# Patient Record
Sex: Male | Born: 1960 | Race: White | Hispanic: No | Smoking: Never smoker
Health system: Southern US, Community
[De-identification: ages and names within clinical notes are randomized; demographics above are authoritative.]

## PROBLEM LIST (undated history)

## (undated) DIAGNOSIS — K219 Gastro-esophageal reflux disease without esophagitis: Secondary | ICD-10-CM

## (undated) DIAGNOSIS — K859 Acute pancreatitis without necrosis or infection, unspecified: Secondary | ICD-10-CM

## (undated) HISTORY — PX: ABDOMINAL SURGERY: SHX537

## (undated) HISTORY — PX: PR VASECTOMY UNI/BI SPX W/POSTOP SEMEN EXAMS: 55250

## (undated) HISTORY — PX: PR TONSILLECTOMY PRIMARY/SECONDARY <AGE 12: 42825

## (undated) HISTORY — PX: PR LAPS ABD PRTM&OMENTUM DX W/WO SPEC BR/WA SPX: 49320

---

## 2001-06-20 ENCOUNTER — Ambulatory Visit: Payer: Self-pay

## 2001-12-12 ENCOUNTER — Ambulatory Visit: Payer: Self-pay

## 2002-01-26 ENCOUNTER — Ambulatory Visit

## 2002-02-21 ENCOUNTER — Ambulatory Visit

## 2009-08-14 ENCOUNTER — Ambulatory Visit

## 2009-09-10 ENCOUNTER — Encounter: Payer: Self-pay | Admitting: Family Medicine

## 2009-09-10 ENCOUNTER — Ambulatory Visit

## 2009-09-10 ENCOUNTER — Ambulatory Visit: Admitting: Family Medicine

## 2009-09-10 DIAGNOSIS — M7540 Impingement syndrome of unspecified shoulder: Secondary | ICD-10-CM | POA: Insufficient documentation

## 2009-09-10 DIAGNOSIS — M940 Chondrocostal junction syndrome [Tietze]: Secondary | ICD-10-CM | POA: Insufficient documentation

## 2009-09-10 DIAGNOSIS — F439 Reaction to severe stress, unspecified: Secondary | ICD-10-CM | POA: Insufficient documentation

## 2009-09-10 NOTE — Patient Instructions (Addendum)
For counseling, see Darrell Whitaker [phone] or Darrell Rogers, MD [phone] or Darrell Layman, MD, PhD [phone] (practice may be full) or Darrell Whitaker [phone] in [place] or Darrell Russel, PhD at [phone] in [place]. Also Darrell Whitaker in [place] at [phone] and Darrell Whitaker at [phone].    EXERCISE: Get at least 30 minutes of uninterrupted aerobic exercise at least every other day. This includes bicycling, jogging, swimming, an aerobics class, or dancing. If instead you choose hiking or walking, this should be done without stopping for at least 45 minutes. All exercise should be done to break a mild sweat and increase our breathing and heart rate, but only as long as we can still carry on a conversation while exercising.     I recommend  avoidance of the sun, especially from 10am to 4pm when the sun is strongest. Wear protective clothing (especially a broad-rimmed hat to cover ears and nose) and sunglasses  Adequate sunscreening for every day use may be different than needed for outdoor activities. For limited everyday exposure, a light, SPF 15 or greater lotion may be applied in the morning. Many make up foundations contain sunscreen that may perform this function. I particularly like Olay Complete SPF 15 (contains Zinc).     For any significant outdoor activity, please use a more water-resistant, more protective sunscreen. There are many excellent types available. I recommend SPF 30 or greater sunscreen with both UVA and UVB protection when outdoors. Reapply sunscreen if ever in the water, even if the sunscreen claims that it is waterproof. The best sunscreens contain Zinc oxide. Zinc makes the sunscreen more effective in blocking the longer wavelength UV rays. Titanium dioxide and Parsol also block longer wavelength rays, but not  quite as effectively in most preparations. Some products that I recommend include:  1. Blue Lizard  2. Total Block  3. Vanicream  4. Neutrogena dry touch 55, 70,  and 85  5. Spectra 3  6. Trader Joe's 30+  A newly approved long wavelength blocking agent, Mexoryl, has recently become available in a few combination preparations. Two types that are available  on the internet are: Lyda Jester and Anthelios  Some of the others listed are also found more easily on the internet or can be ordered by your pharmacist.    [facility] SHOULD I GO TO?  Should you require hospital admission, we are pleased to offer the services  of [facility], where [facility] hospital-based physicians provide in-patient care. The [facility] Emergency Department is open 24 hours per day, 7 days per week. [facility] is located at [facility] in [place]. The telephone number is [phone].    HOW DO I E-MAIL MY DOCTOR?  To contact your physician by e-mail, ask my medical assistant for an identifier number and instructions for "My Chart"    HOW DO I TAKE CARE OF PRESCRIPTION REFILLS?  To obtain prescription refills, ask your primary care physician during your office appointment. If you need refills and don't have an office appointment, contact your pharmacy. Your pharmacy will then contact your physician for refill authorization. We will refill the prescription within 48 hours. If a holiday or weekend is coming please allow extra time.     To refill a triplicate prescription needed for some narcotics, contact our office. The medical assistant will contact you when the triplicate is ready for pick up or if you need a doctors appointment first.     If you use a mail  order pharmacy and need a 90 day prescription, contact our office. The medical assistant will contact you when the 90 day prescription is ready for pick up.    The 'on call' physician will not authorize prescription refills.  Please remember, Darrell Whitaker and good medical practice require your doctor to perform a 'good faith medical examination' prior to prescribing any medication.     HOW DO I  GET COPIES OF MY RECORDS?  To request copies of your medical records, you will need to complete and sign a medical release form. You may complete a release form at the front desk or you may mail one in. Adults must sign their own release form. A parent or guardian must sign for anyone under 38 years of age. State law requires that records be released within 15 days of signing a medical release form. A fee may apply for copies of your records.       RELIABLE WEBSITES FOR HEALTH INFORMATION:   OSTEOPEROSIS INFORMATION: National Osteoperosis Foundation = RecruitSuit.[place]  Also U.S. Dept. of Health Services = www.https://www.Waterman-walter.com/   MANAGING CHRONIC DISEASE BeverageRep.es. You can also call [phone] for information for management of chronic disease, including  diabetes, asthma, heart failure, weight management, and smoking cessation.   EXERCISE RELATED: bodyforlife.com   OTHER GENERAL WEBSITES:  AwakeningMoments.co.nz   www.cancer.org   www.diabetes.org   www.americanheart.org   FootballExhibition.com.br   www.clinicaltrials.gov   www.familydoctor.org   .....Marland KitchenMarland KitchenOne of my favorites  www.healthfinder.gov   www.kidshealth.org   TanExchange.nl   ......Marland KitchenMarland KitchenAnother of my favorites  http://medlineplus.gov   www.cancer.gov   https://www.west.net/   www.tobaccofreeca.com   nihseniorhealth.com     MEDICAL ALERT CARDS * www.medicalert.org or [phone] - to put on a person for emergency use.   ADVANCED MEDICAL DIRECTIVE FORMS:  RecruitSuit.co.za and other publications.   TRAVEL: For health information on international travel contact [facility]. Phone: [phone]     Please bring your MEDICATION LIST ( include name of medication, dose and frequency of use. Include all over-the-counter medications and drug allergies) to all office visits and any emergency room visits.     Questions? Call 1-800-2-[place] or email at ForwardButton.com.br    The [facility] :[address] , [place], [place] [address] , Phone: [phone] ; LABORATORY HOURS: 7:00 AM - 5:00 PM MONDAY-FRIDAY No appointment needed: [phone] ; driving directions: [address] at [address] Turn right at [address] and right at [employer].     FREE CONFIDENTIAL RAPID HIV testing for the virus that causes AIDS. Results in 20 minutes. Contact the [facility] in [place]: [phone]     The following is a list of HEALTH SCREENING GUIDELINES. If you are not current you are advised to schedule a COMPLETE PHYSICAL EXAM to review your general health and bring up to date your screening tests.   WELL WOMEN EXAM: Begin screen within 1 year of onset of sexual activity or age 57 which ever comes first. It is recommended that you have a pelvic and pap smear examination yearly until you have two consecutively normal exams. After that, examination ever 3 years is acceptable for screening assuming all past pap smears have been normal. This examination also includes a breast check. Remember, any time you have pelvic or breast symptoms you should be reexamined.   MAMMOGRAM SCREENING: This is important for breast cancer screening. It is recommended yearly starting at age 56.   BREAST  EXAMINATION: Recommended to be performed by a physician every year after age 65. This is a cancer screening examination in addition to the annual mammogram. Both exams complement each other.   COLONOSCOPY SCREENING: It is recommended that you have a colonoscopy every 10 years, for colon cancer screening, starting at age 78 years old. If you have primary family members (father, mother, brother, sister) with colon cancer you should start screening at least 10 years before the age that they were diagnosed with colon cancer. This is a special instrument that is threaded through the colon, after colon preparation, to visualize the inside surface of the colon looking for polyps and precancerous  lesions.   CHOLESTEROL BLOOD SCREENING: This should be done routinely every 3-5 years. If your values are elevated above normal, testing should be done more frequently per your doctor's advice. Cholesterol along with other risk factors can help predict your future risk of cardiovascular events. Other risk factors include; a positive family history (father, mother , brother, sister) for premature heart attack, ( men less than 10 years old and women less than 59 years old), current or recent tobacco use, diabetes, a waist measurement for women (at the largest diameter near the belly button) of 35 inches or greater and for men 40 inches or greater, fasting blood sugar of 100 or greater, high blood pressure above 120/80. Cholesterol guidelines for lowering cardiovascular risk include a total cholesterol of less than 200, HDL(good) of at least 40 or above in men and 50 or above in women (higher is always better), LDL (bad) best if less than 130 and, if you are at high risk because of multiple risk factors, i.e. diabetes, previous heart attack, stroke, peripheral vascular disease, then the LDL goal is less than 70.  BLOOD PRESSURE: Should be monitored periodically to assure that your values are in the normal range most of the time. Blood pressure should usually be monitored after 10 minutes of quiet rest, sitting in a chair with feet on the floor. Studies show that <120/80 has the lowest risk. If your average blood pressure goes above 130/85 you should consult your physician regarding management. Most pharmacy locations have complementary blood pressure measuring machines, but your own machine at home is more accurate. Salt restriction, less than 2300 mg daily, can be very helpful. Nutritional labels will list sodium content of foods.   IMMUNIZATIONS: CHILDHOOD IMMUNIZATIONS are advised through age 39. Ask doctor for details. TETANUS/DIPTHERIA advised for adults every ten years life long, at least every 5 years for  significant skin breaking injuries in high risk jobs.  FLU VACCINATION annually > age 39 and in high risk persons and in kids age 57 months to 5 years of age. PNEUMONIA VACCINATION at least one time after 65 and every 5 years for those who have had their spleens removed or high risk individuals with diabetes, significant lung disease or other debilitating diseases. ZOSTER VACCINATION for all persons > 60. This immunization is to help reduce the risk of Herpes Zoster/Shingles and the severe chronic pain that could linger after shingles. VARICELLA VACCINATION for all adults without evidence of varicella (chickenpox) immunity. HEPATITIS A VACCINATION advised for all high risk patients including, chronic liver disease, clotting factor recipients, men who have sex with men, illicit drug users, and travelers to endemic areas. HEPATITIS B VACCINATION advised for all high risk patients including, HIV, chronic liver disease, end stage renal disease, for those who are single and are sexually active , men who have sex  with men, IV drug users, HBV patient household/sexual contacts, inmates, healthcare workers, clients/staff working with developmentally disabled or high risk patients and travel to endemic areas. HUMAN PAPILLOMAVIRUS VACCINATION (HPV) (Gardasil) advised for women age 44-26, administration preferred prior to becoming sexually active. MENINGOCOCCAL VACCINATION advised 11-12 year olds or before entering high school; for high risk persons age 24-55 including college freshman in dormitories, lab workers exposed to [employer]. meningitidis, [employer] recruits, those who have their spleens removed, and travelers to endemic areas.   VISION SCREENING: This is recommended every 2 years after 40 to screen for glaucoma, cataracts, macular degeneration and other eye diseases. If you have diabetes or severe high blood pressure it is recommended that you be examined annually by an ophthalmologist or optometrist.   TOBACCO: Tobacco in any  form is a hazard to good health. It can cut your life short and create needless loss and suffering. No amount of tobacco is considered safe. This includes the use of marijuana.   For further information consult: LearningDermatology.pl.   ALCOHOL: There are health and social risks of excess alcohol intake. Most health professionals would advise no more than two drinks daily for men and one drink daily for women. The following would be equal to one drink: Wine = 5 ounces, Beer = 12 ounces, Hard liquor = 1 ounce.   EXERCISE: Get at least 30 minutes of aerobic physical activity on at least 4 days a week. This exercise can be walking, hiking, cycling, swimming, dancing, or an aerobics class.   WEIGHT: Should be monitored periodically to maintain your weight in the normal range for optimal health. Body Mass Index (BMI) is used to help determine your ideal body weight. This is a calculation using your weight and height measurement. Ideal range is 18-25. Below 25 is in the healthy range. Below 18 also carries health risks equal to being overweight. By definition if you are in the 25-30 range you are considered overweight. If you are in the 30-40 range you are considered obese. If you are above 40 you are considered morbidly obese. Being overweight carries significant risk to your health such as cancer, arthritis, diabetes, hypertension, stroke, heart attacks and other cardiovascular diseases.   HIGH FIBER, LOW FAT DIET. High fiber comes from vegetables, fruits, products made with 100% whole grains, legumes, and raw nuts (not fried in oil). Fats should be limited to no more than 10% of your total calories if you are trying to loose weight and no more than 30% of your total calories for maintenance. The best fats are vegetable fats which are monounsaturated. This would be olive oil and canola oil. Other oils (corn & soy) should be avoided because they cause inflammation in the body which increases health risk. The best fats  are cold pressed but are more unstable; some may need to be refrigerated. Animal fats should be at the lowest consumption level. Trans fats or hydrogenated fats should be avoided. Reading nutrition labels on the package can be very helpful. High fructose corn syrup is a fat building sweetener and should be avoided. Other sugars should be limited to no more than 75 gm daily.    Use rubberband to do daily shoulder exercises. Do 30 inward rotations and 30 outward rotations with forearm on your lap once or twice daily. Once improving, you can add 30 arm lifts in a standing position. Do all of these exercises for 2 months , then as needed if symptoms return.

## 2009-09-10 NOTE — Nursing Note (Signed)
>>   Darrell Whitaker     Tue Sep 10, 2009  4:30 PM  Immunization VIS documentation(s) were given to pt to review. All questions were answered and the patient consented to the Immunization(s) being given. Patient allergies were reviewed and no contraindications were found. The immunization(s) were given as ordered. The patient was observed for any immediate reactions to the vaccine. None were observed.Medication verified with Doctor Ruben Gottron     >> Darrell Whitaker     Tue Sep 10, 2009  4:13 PM  Vital signs taken, allergies verified, screened for pain,   Darrell Malling [place], MA

## 2009-09-10 NOTE — Progress Notes (Signed)
SUBJECTIVE:  Darrell Whitaker is a 49yr old male who comes in for a get acquainted visit. I have reviewed the patient's medical history in detail and updated the computerized patient record.   The patient requests a physical exam / complains of months of left posterior shoulder pain especially with use of arm to his side; no known injury or neck / arm symptoms.  Also complains of occasional soreness left edge of sternum for few months especially after long work day. Also stressed by work Engineer, manufacturing systems; 7 years at [employer]  Significant past medical problems include pancreatitis surgery age 32; scar tissue abdomen removed age 23s; ok since.  Is due for a physical exam.  Is in need of adacel vaccine(s).    No current outpatient prescriptions on file.    Review of Systems -   Constitutional: negative.  Ears, Nose, Mouth, Throat: negative.  CV: negative.  Resp: negative.  GI: negative.  Musculoskeletal: negative.  Psych: Mood pt's report, euthymic; feels fine when not at work.    No past medical history on file.  Past Surgical History   Procedure Date    Vasectomy, unilat/bilat, w/postop semen exam     Lap,diagnostic abdomen age 31, 30s     pancreatitis, and remove scar tissue    Removal of tonsils,<12 y/o        History   Social History    Marital Status: MARRIED     Spouse Name: Scientist, research (medical)     Number of Children: 2    Years of Education: N/A   Occupational History    [employer]    Social History Main Topics    Smoking status: Never Smoker     Smokeless tobacco: Never Used    Alcohol Use: 1.0 oz/week     2 drink(s) per week    Drug Use: No    Sexually Active: Yes -- Male partner(s)     Birth Control/ Protection: Surgical   Other Topics Concern    Not on file   Social History Narrative    No narrative on file       OBJECTIVE:  Vital signs noted  Mental status exam;  is alert, oriented  to time, person and place. Normal thought content, speech, affect, mood and dress are noted.    S1 and S2 normal, no  murmurs, clicks, gallops or rubs. Regular rate and rhythm. Chest is clear; no wheezes or rales. No edema or JVD.   Shoulder exam left shows positive impingement signs are present with pain at high arc of abduction and forward flexion. There is no tenderness or shoulder laxity. Left mid costal-chondral joint slight tenderness.      ASSESSMENT:  786.52Q Chest wall pain  V05.8 Vaccin for disease NEC  726.2AR Shoulder impingement Left  733.6K Costochondritis  V62.89E Situational stress    Educated regarding adapting to work stress; to increase aerobic exercise , do counseling, set expectations  Follow up and advice per Epicare orders and patient instructions.  Call if any problems worsen or if develops any new signs or symptoms of illness.  More than 50% of 45 minute visit was spent counseling the patient on their 5 medical diagnosis as above.  Barriers to Learning assessed: none. Patient verbalizes understanding of teaching and instructions.  Electronically signed by:  Conception Oms, MD    Associate Physician  [facility] [place] [facility]   Phone [phone]

## 2009-10-10 NOTE — Progress Notes (Signed)
SUBJECTIVE:  Darrell Whitaker is a 49yr old male presenting for physical exam .  Patient Active Problem List   Diagnoses Code    Shoulder impingement Left 726.2AR    Costochondritis 733.6K    Situational stress V62.89E    Last PE 5/11 V70.0     No current outpatient prescriptions on file.      ALLERGIES:  Not on File    New concerns: none; labs ordered since none here previously    ROS:  Constitutional: negative for fatigue, unintentional weight loss, fever.  Eyes: negative for blurry vision, red eyes, eye pain.  Ears, Nose, Mouth, Throat: no difficulty swallowing, hoarseness, post nasal drip, nosebleeds, rhinorrhea, loss of sense of smell, hearing loss, ear pain, discharge from ears.  CV: no palpitations, syncope, chest pain, paroxysmal nocturnal dyspnea, orthopnea, lower extremity edema, claudication.  Resp: no cough, hemoptysis, shortness of breath; gets some snoring but rare am fatigue; no history of  apnea spells while sleeping, pleuritic pain, wheezing, dyspnea on exertion.  GI: no vomiting, dysphagia, nausea; does get heartburn twice a week for last 18 months ; denies early satiety, hematemesis, abdominal pain, bleeding from rectum, melena, hematochezia, constipation, diarrhea.  GU: no nocturia, dysuria, frequency, hesitancy, hematuria, incontinence, lumps in testicles, sexual difficulties.  Musculoskeletal: no AM joint stiffness, joint swelling, new joint pain, back pain, muscle weakness, muscle pain.  Integumentary: no moles that have changed, rash, bruising, lumps or bumps, hair loss.  Neuro: no confusion, headaches, feel faint, seizures, vertigo, memory loss, paralysis/weakness, numbness or tingling, clumsiness, tremor.  Psych: no ahedonia, depressed mood, insomnia, anxiety, suicidal ideation.  Endo: no cold intolerance, heat intolerance, polyphagia, polydipsia, polyuria, excessive hair growth.  Heme/Lymphatic: no anemia, bleeding disorder, abnormal bleeding, abnormal bruising, swollen  nodes.  Allergy/Immun: no itchy eyes, itchy nose, clear nasal secretions.  Breast: no lump in breast, nipple bleeding or discharge.    OBJECTIVE: The patient appears well, in no apparent distress.  Vital signs noted; repeat blood pressure 122/78  Scalp / head grossly normal.  Oral mucosa: normal  Eyes: Conjunctivae and corneas clear. PERRL, EOM's intact.   Ears: Normal TMs and canal.  Nose: nasal turbinates with pink color and no edema  Neck: Supple. No adenopathy, or thyromegaly. If age >37: normal carotid pulses without bruits  Heart: S1 & S2 normal, normal rate and regular rhythm, no murmurs, clicks, or gallops.  Lungs: Clear, good air entry, no wheezes, rhonchi or rales.  Chest: No chest deformities or masses noted. No axillary adenopathy.  Abdomen: Abdomen slightly obese; soft without tenderness, guarding, masses or organomegaly. No bruit.  Genital/Rectal Exam: genitals normal without hernia, rectal normal, no masses, guiac negative stool, prostate normal in size without masses or nodules  Extremities: No cyanosis, clubbing, or edema.  Skin: No suspicious lesions; no rash except patches of excoriated eczematous skin on fingers, elbows . Neuro: Alert and oriented. CN 2-12 grossly normal. Strength symmetrical. Gait normal. Negative Romberg. No tremor or coordination deficit.    EKG: normal    ASSESSMENT/PLAN:  V70.0 Routine general medical examination at a health care facility  (primary encounter diagnosis)  726.2AR Shoulder impingement Left  V81.0A Screening, heart disease, ischemic  V75.9B Screening examination for infectious disease  V76.44D Prostate cancer screening  V78.0A Screening, anemia, deficiency, iron    Health Care Maintenance: c-scope due in 2 years   PLAN: per Epicare orders and patient instructions.  Next physical / comp eval in 1-2 year(s).   I did review patient's past medical and  family/social history, no changes noted.  Barriers to Learning assessed: none. Patient verbalizes understanding of  teaching and instructions.    Electronically signed by:  Conception Oms, MD    Associate Physician  [facility] [place] [facility]   Phone [phone]

## 2009-10-11 ENCOUNTER — Ambulatory Visit: Admitting: Family Medicine

## 2009-10-11 ENCOUNTER — Ambulatory Visit

## 2009-10-11 VITALS — BP 146/87 | HR 85 | Temp 98.0°F | Resp 16 | Ht 72.0 in | Wt 184.0 lb

## 2009-10-11 MED ORDER — TRIAMCINOLONE ACETONIDE 0.1 % TOPICAL CREAM
TOPICAL_CREAM | Freq: Two times a day (BID) | TOPICAL | Status: AC
Start: 2009-10-11 — End: 2010-10-11

## 2009-10-11 NOTE — Patient Instructions (Signed)
Call if not better regarding rashes within 2 weeks     To help your dry skin, use skin moisturizers such as  Eucerin and Lubriderm or Keri lotion twice a day, including just after bathing (apply to skin while it is still wet). Avoid excessive soap and bathing which may dry the skin. Use only unscented Dove or Neutrogena soap when washing hands or showering. Do not use liquid soaps. Call if your skin is not much improved after a month of these measures.  Can also apply vaseline to hands (under white cotton gloves) at bedtime and remove the next morning if having severe dry skin of fingers or hands.    EXERCISE: Get at least 30 minutes of uninterrupted aerobic exercise at least every other day. This includes bicycling, jogging, swimming, an aerobics class, or dancing. If instead you choose hiking or walking, this should be done without stopping for at least 45 minutes. All exercise should be done to break a mild sweat and increase our breathing and heart rate, but only as long as we can still carry on a conversation while exercising.

## 2009-10-11 NOTE — Nursing Note (Signed)
>>   Darrell Whitaker     Fri Oct 11, 2009  4:33 PM  EKG performed Vital signs taken, allergies verified, screened for pain,   Jeanelle Malling [place], MA

## 2009-12-02 ENCOUNTER — Encounter

## 2011-03-04 ENCOUNTER — Ambulatory Visit

## 2011-03-04 NOTE — Nursing Note (Signed)
>>   Darrell Whitaker     Wed Mar 04, 2011  5:56 PM  The Inactivated Influenza Vaccine VIS document was given to patient to review and any questions were referred to the physician.  The patient was asked the following questions to determine if there is any reason we should not give the Influenza Vaccine;  Is the person to be vaccinated sick today?no  Does the person to be vaccinated have an allergy to eggs, latex or to a component of the vaccine?no  Has the person to be vaccinated ever had a serious reaction to influenza vaccine in the past?no  Has the person to be vaccinated ever had Guillain-Barre syndrome?no   The Influenza Vaccine was then administered per protocol. The patient was observed for immediate reactions to the vaccine per protocol. None were observed.

## 2012-08-24 ENCOUNTER — Telehealth: Payer: Self-pay | Admitting: FAMILY PRACTICE

## 2012-08-24 ENCOUNTER — Encounter: Payer: Self-pay | Admitting: FAMILY PRACTICE

## 2012-08-24 ENCOUNTER — Ambulatory Visit: Admitting: FAMILY PRACTICE

## 2012-08-24 ENCOUNTER — Ambulatory Visit: Payer: Commercial Managed Care - PPO

## 2012-08-24 DIAGNOSIS — Z8719 Personal history of other diseases of the digestive system: Secondary | ICD-10-CM | POA: Insufficient documentation

## 2012-08-24 MED ORDER — CYCLOBENZAPRINE 10 MG TABLET
5.0000 mg | ORAL_TABLET | Freq: Every day | ORAL | Status: AC | PRN
Start: 2012-08-24 — End: 2012-09-23

## 2012-08-24 MED ORDER — HYDROCODONE 5 MG-ACETAMINOPHEN 325 MG TABLET
1.0000 | ORAL_TABLET | Freq: Four times a day (QID) | ORAL | Status: AC | PRN
Start: 2012-08-24 — End: 2012-09-23
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
## 2012-08-24 NOTE — Patient Instructions (Addendum)
For your back - I would recommending icing your back tonight, then start using warm compresses tomorrow.  Be sure to keep your back limber and stretch it out regularly.    The key to prompt recovery is get your back moving ASAP with easy, gentle exercises.        For the pain, you should start with either ibuprofen or naproxen.  If you are still having pain, you may use hydrocodone on top of the ibuprofen or naproxen. I have also given you a rx for cyclobenzeprine, which is a muscle relaxant, but is very sedating.  It will help you sleep.           Low Back Pain: Exercises  Your Care Instructions  Here are some examples of typical rehabilitation exercises for your condition. Start each exercise slowly. Ease off the exercise if you start to have pain.  Your doctor or physical therapist will tell you when you can start these exercises and which ones will work best for you.  How to do the exercises  Press-up    1. Lie on your stomach, supporting your body with your forearms.  2. Press your elbows down into the floor to raise your upper back. As you do this, relax your stomach muscles and allow your back to arch without using your back muscles. As your press up, do not let your hips or pelvis come off the floor.  3. Hold for 15 to 30 seconds, then relax.  4. Repeat 2 to 4 times.  Alternate arm and leg (bird dog) exercise    Note: Do this exercise slowly. Try to keep your body straight at all times, and do not let one hip drop lower than the other.  1. Start on the floor, on your hands and knees.  2. Tighten your belly muscles.  3. Raise one leg off the floor, and hold it straight out behind you. Be careful not to let your hip drop down, because that will twist your trunk.  4. Hold for about 6 seconds, then lower your leg and switch to the other leg.  5. Repeat 8 to 12 times on each leg.  6. Over time, work up to holding for 10 to 30 seconds each time.  7. If you feel stable and secure with your leg raised, try raising  the opposite arm straight out in front of you at the same time.  Knee-to-chest exercise    1. Lie on your back with your knees bent and your feet flat on the floor.  2. Bring one knee to your chest, keeping the other foot flat on the floor (or keeping the other leg straight, whichever feels better on your lower back).  3. Keep your lower back pressed to the floor. Hold for at least 15 to 30 seconds.  4. Relax, and lower the knee to the starting position.  5. Repeat with the other leg. Repeat 2 to 4 times with each leg.  6. To get more stretch, put your other leg flat on the floor while pulling your knee to your chest.  Curl-ups    1. Lie on the floor on your back with your knees bent at a 90-degree angle. Your feet should be flat on the floor, about 12 inches from your buttocks.  2. Cross your arms over your chest.  3. Slowly tighten your belly muscles and raise your shoulder blades off the floor.  4. Keep your head in line with your body, and  do not press your chin to your chest.  5. Hold this position for 1 or 2 seconds, then slowly lower yourself back down to the floor.  6. Repeat 8 to 12 times.  Pelvic tilt exercise    1. Lie on your back with your knees bent.  2. "Brace" your stomach. This means to tighten your muscles by pulling in and imagining your belly button moving toward your spine. You should feel like your back is pressing to the floor and your hips and pelvis are rocking back.  3. Hold for about 6 seconds while you breathe smoothly.  4. Repeat 8 to 12 times.  Heel dig bridging    1. Lie on your back with both knees bent and your ankles bent so that only your heels are digging into the floor. Your knees should be bent about 90 degrees.  2. Then push your heels into the floor, squeeze your buttocks, and lift your hips off the floor until your shoulders, hips, and knees are all in a straight line.  3. Hold for about 6 seconds as you continue to breathe normally, and then slowly lower your hips back down  to the floor and rest for up to 10 seconds.  4. Do 8 to 12 repetitions.  Hamstring stretch in doorway    1. Lie on your back in a doorway, with one leg through the open door.  2. Slide your leg up the wall to straighten your knee. You should feel a gentle stretch down the back of your leg.  3. Hold the stretch for at least 15 to 30 seconds. Do not arch your back, point your toes, or bend either knee. Keep one heel touching the floor and the other heel touching the wall.  4. Repeat with your other leg.  5. Do 2 to 4 times for each leg.  Hip flexor stretch    1. Kneel on the floor with one knee bent and one leg behind you. Place your forward knee over your foot. Keep your other knee touching the floor.  2. Slowly push your hips forward until you feel a stretch in the upper thigh of your rear leg.  3. Hold the stretch for at least 15 to 30 seconds. Repeat with your other leg.  4. Do 2 to 4 times on each side.  Wall sit    1. Stand with your back 10 to 12 inches away from a wall.  2. Lean into the wall until your back is flat against it.  3. Slowly slide down until your knees are slightly bent, pressing your lower back into the wall.  4. Hold for about 6 seconds, then slide back up the wall.  5. Repeat 8 to 12 times.  Follow-up care is a key part of your treatment and safety. Be sure to make and go to all appointments, and call your doctor if you are having problems. It's also a good idea to know your test results and keep a list of the medicines you take.    Where can you learn more?    Go to [email or web address]   Enter [phone] in the search box to learn more about "Low Back Pain: Exercises."     2006-2013 [employer]. Care instructions adapted under license by [facility]. This care instruction is for use with your licensed healthcare professional. If you have questions about a medical condition or this instruction, always ask your healthcare professional. [employer] disclaims any warranty  or liability for your use of this information.  Content Version: 9.7.145117; Last Revised: April 01, 2010
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
## 2012-09-15 ENCOUNTER — Ambulatory Visit: Payer: Commercial Managed Care - PPO

## 2012-09-20 ENCOUNTER — Ambulatory Visit: Payer: Commercial Managed Care - PPO

## 2012-09-21 ENCOUNTER — Ambulatory Visit: Payer: Commercial Managed Care - PPO | Admitting: FAMILY PRACTICE

## 2013-01-23 ENCOUNTER — Other Ambulatory Visit: Payer: Self-pay

## 2013-01-28 ENCOUNTER — Other Ambulatory Visit: Payer: Self-pay

## 2013-02-04 ENCOUNTER — Other Ambulatory Visit: Payer: Self-pay

## 2013-02-18 ENCOUNTER — Other Ambulatory Visit: Payer: Self-pay

## 2013-04-01 ENCOUNTER — Other Ambulatory Visit: Payer: Self-pay

## 2014-02-10 ENCOUNTER — Other Ambulatory Visit: Payer: Self-pay

## 2014-12-06 ENCOUNTER — Ambulatory Visit: Payer: Commercial Managed Care - PPO | Admitting: Legal Medicine

## 2014-12-06 ENCOUNTER — Encounter: Payer: Self-pay | Admitting: Legal Medicine

## 2014-12-06 VITALS — BP 116/74 | HR 68 | Temp 97.3°F | Resp 20 | Ht 72.0 in | Wt 189.1 lb

## 2014-12-06 DIAGNOSIS — R21 Rash and other nonspecific skin eruption: Secondary | ICD-10-CM

## 2014-12-06 DIAGNOSIS — F4321 Adjustment disorder with depressed mood: Secondary | ICD-10-CM

## 2014-12-06 DIAGNOSIS — F4329 Adjustment disorder with other symptoms: Principal | ICD-10-CM

## 2014-12-06 MED ORDER — TRIAMCINOLONE ACETONIDE 0.1 % TOPICAL CREAM
TOPICAL_CREAM | Freq: Two times a day (BID) | TOPICAL | Status: DC
Start: 2014-12-06 — End: 2015-10-22

## 2014-12-06 NOTE — Nursing Note (Signed)
Patient identification verified by last name and date of birth.  Darrell Whitaker is here to establish care with Dr. Jeronimo Greaves.  Patient is here to discuss mood problems following the passing of his father.  Jefm Petty, MA I

## 2014-12-06 NOTE — Progress Notes (Signed)
Chief Complaint   Patient presents with    Establish Care    Depression     Depression/Anxiety; Grief Response     Rash     torso and back of thighs       Darrell Whitaker is a 54yr old male who presents as a new patient to this clinic with need to establish with a new PCP.    Has concerns about depression and anxiety.  Recent death of father.  Died in 08/12/22 this year, 4 months ago.  Troubled with all the bad things that happened, his father was a "terrible" father.  He feels this is not effecting his relationships or work.  But does get very emotional at times when he thinks about how he can not even grieve normally.    Has new rash. Torso for last week.  Itchy. No knew exposures.      Body temperatures: no fever apparent.    REVIEW OF SYSTEMS:  Constitutional: negative.  Eyes: negative.  Ears, Nose, Mouth, Throat: negative.  CV: negative.  Resp: negative.  GI: heartburn or reflux.  GU: negative.  Musculoskeletal: muscle pain.  Integumentary: rash.  Neuro: negative.  Psych: crying spells.      Patient Active Problem List   Diagnosis    Shoulder impingement Left    Costochondritis    Situational stress    History of pancreatitis     No past medical history on file.  Past Surgical History   Procedure Laterality Date    Vasectomy, unilat/bilat, w/postop semen exam      Pr lap,diagnostic abdomen  age 51, 30s     pancreatitis, and remove scar tissue    Pr removal of tonsils,<12 y/o       History   Substance Use Topics    Smoking status: Never Smoker     Smokeless tobacco: Never Used    Alcohol Use: 1.2 oz/week     2 Standard drinks or equivalent per week     Family History   Problem Relation Age of Onset    Allergies Mother     Allergies Sister     Allergies Brother     Cancer Paternal Grandfather      liver?    Diabetes Maternal Grandfather     Cancer Paternal Uncle      liver cancer    Heart Father     Asthma Mother     Asthma Brother     Thyroid Sister        ACTIVE MEDICATIONS (patient  reports taking):  No outpatient prescriptions have been marked as taking for the 12/06/14 encounter (Office Visit) with Lorenda Hatchet, MD.       ALLERGIES:  No Known Allergies    PHYSICAL EXAM:   BP 116/74 mmHg  Pulse 68  Temp(Src) 36.3 C (97.3 F) (Tympanic)  Resp 20  Ht 1.829 m (6')  Wt 85.775 kg (189 lb 1.6 oz)  BMI 25.64 kg/m2  SpO2 99%  GEN:   The patient appears in no apparent distress and is well developed and well nourished  SKIN:  Maculopapular rash and torso, raised areas of lower flanks, urticaria like.   PSYCHOLOGICAL:  Normal thought content, speech, affect, mood, and dress are noted.  PHQ9: 6.  Major criteria; 0,2.  Suicidal Ideation; 0.  GAD7: 3.  Major criteria; 1,0.    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  Pharmacological treatment not indicated at this time.  Plan: he agrees to telepsychiatry.      (R21) Rash due to allergy  Comment: Possible Grover's.     Plan: topical steroid.    I spent more than 25 min with the patient, more than half the time in case management and counseling.      Return if symptoms worsen or fail to improve.     Patient instructed to seek medical reevaluation if treatment is not as effective as anticipated, condition worsens, or has side effects from medication.  Barriers to Learning assessed: none. Patient verbalizes understanding of teaching and instructions.    Electronically signed by:  Elaina Hoops, MD  Diplomate of American Board of Family Practice  [facility]  Phone: [phone]

## 2015-01-09 ENCOUNTER — Ambulatory Visit: Payer: Self-pay

## 2015-01-15 ENCOUNTER — Telehealth: Payer: Self-pay | Admitting: Legal Medicine

## 2015-01-15 NOTE — Telephone Encounter (Signed)
Patient was seen last week in the telepsych program and  wants to know when he should schedule/d a follow up appointment - he wants to schedule/d an appointment this Friday but is not sure if that is to soon  Please review chart notes  Please advise  Please call

## 2015-01-15 NOTE — Telephone Encounter (Signed)
Please notify patient, too soon for any recommendations from telepsychiatry.  Can take a week or two.  I well contact him when the recommendations come in.

## 2015-01-15 NOTE — Telephone Encounter (Signed)
Patient identified x3, patient informed

## 2015-01-25 ENCOUNTER — Other Ambulatory Visit: Payer: Self-pay | Admitting: Legal Medicine

## 2015-01-25 ENCOUNTER — Encounter: Payer: Self-pay | Admitting: Psychiatry

## 2015-01-25 DIAGNOSIS — F321 Major depressive disorder, single episode, moderate: Secondary | ICD-10-CM | POA: Insufficient documentation

## 2015-01-25 MED ORDER — SERTRALINE 50 MG TABLET
50.0000 mg | ORAL_TABLET | Freq: Every day | ORAL | 5 refills | Status: AC
Start: 2015-01-25 — End: 2016-01-20
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
## 2015-01-28 NOTE — Progress Notes (Signed)
Darrell Hatchet, MD  Darrell Whitaker Fp 3 days ago              Please notify patient, psychiatry has advised he start sertraline, has been ordered.,   Should be seen in 4 weeks by me for follow up.  (Routing comment)

## 2015-01-28 NOTE — Progress Notes (Signed)
Patient identification verified x 3.  Advised patient of prescription sent and advice to follow up in 4 weeks. Patient states he might need sooner follow up to discuss disability and he is going through a divorce. He was unable to schedule while I am on the phone because he is very upset and stressed about setting appointments with his attorney for the divorce. He will call back in a couple days to make an appointment. He states it will probably be more like 2 weeks vs the suggested 4 weeks since he is very stressed and upset.  Jefm Petty, MA I

## 2015-02-22 ENCOUNTER — Telehealth: Payer: Self-pay

## 2015-02-26 NOTE — Telephone Encounter (Signed)
Patient called again for number to HIM to get records regarding psychiatric visit   No further follow up needed for this message.

## 2015-06-25 ENCOUNTER — Ambulatory Visit: Payer: Self-pay

## 2015-07-04 ENCOUNTER — Ambulatory Visit: Payer: Self-pay

## 2015-07-16 ENCOUNTER — Encounter: Payer: Self-pay | Admitting: Psychiatry

## 2015-07-16 NOTE — Progress Notes (Signed)
Medical Record Number:   [id number]  Patient:   Darrell Whitaker  Date of Birth:   Mar 31, 1961  Date of Visit:   07/16/2015  Referring Physician/Site: PCN    Telepsychiatry Note - Research Patient  Synchronous/Asynchronous  Follow up - 6 Mo/12 Mo/18 Mo/24 Mo    GAF: 63    CGI: 3    Length of assessment and documentation: 40 minutes    Presenting Complaint: Depression    History of Present Illness:  Darrell Whitaker is a 55yr old male who has recently experienced the death of his father and his wife being unfaithful and decided to leave him.  Both of these events have resulted in profound depression and anxiety, with no active SI, plan or intent.  No manic or psychotic symptoms and no recent use of ETOH / illicit drugs.  Darrell Whitaker is quite lonely and is crying more than ever.  He has seen a therapist 4-5 times and is reading a therapeutic book, which he finds helpful.  He is also more interested in geetting involved with religion.  He has not started the recommended and prescribed Zoloft, and seems he might not be interested in doing this.  He had a major "set back" yesterday when thinking about his wife.    Current medications:  Current Outpatient Prescriptions on File Prior to Visit   Medication Sig Dispense Refill    Sertraline (ZOLOFT) 50 mg Tablet Take 1 tablet by mouth every morning. 30 tablet 5    Triamcinolone (KENALOG) 0.1 % Cream Apply  to the affected area 2 times daily. (rash) Until clear. 30 g 0     No current facility-administered medications on file prior to visit.             Labs:   Chemistry Profile: No results found for this or any previous visit.  CBC with Differential: No results found for this or any previous visit.  Cholesterol/Lipids: No results found for this or any previous visit.  Liver Enzyme: No results found for this or any previous visit.  Fasting Glucose: No results found for this or any previous visit.  Lithium: No results found for this or any previous visit.  Valproate: No  results found for this or any previous visit.  Carbamazepine: No results found for this or any previous visit.  Prolactin: No results found for this or any previous visit.  Thyroid-stimulating Hormone: No results found for this or any previous visit.     MSE: Attitude: pleasant   Speech: normal volume, rate, and pitch  Mood (pt's report) :Mood pt's report, depressed mood  Affect: blunted  Suicidal Ideation: no  Thought Process: logical and goal directed  Thought Content: no thought disorder or psychotic symptoms  Judgment: good  Psychosis: no  Cognition is intact.   Insight: good.    Impression and Codes:  1. GAD   2. MDD - recurrent - moderate  Self-harm / violence potential: behavior = low risk for suicide    Treatment Recommendations:  1. Suggest reconsideration of Zoloft 50 mg, as this may help with his depression  2. Recommend more frequent supportive psychotherapy  Follow-up:  1. Primary care: make changes and see patient in 6 wks to assess.  2. e-MH:Research telepsychiatry in 6 mos prn or doc-to-doc consultation by EMR messaging  3. [facility]/Emergency Department    Suggestions for further information to be collected, or for further assessment at future telepsych consults:       Electronically Signed By:  Selinda Flavin, DO  [phone]

## 2015-07-17 NOTE — Progress Notes (Signed)
Please notify patient, he should make appointment to discuss treatment recommendations by psychiatry.

## 2015-07-19 NOTE — Progress Notes (Signed)
Called and gave patient Dr San JettyJames Nichol's request - patient is more then willing to schedule/d the appointment but wants to know if the appointment will be covered with research monies , at this time money is very tight due to personal issues and he can not afford to be seen   Please advise    Patient knows that Dr Elaina HoopsJames Nichol is out of office until Monday

## 2015-07-21 NOTE — Progress Notes (Signed)
Please notify patient, visit is not covered by research money.

## 2015-07-23 NOTE — Progress Notes (Signed)
Spoke to patient and he states he has a high deductible and really can't afford the cost of a office visit especially without knowing the cost in order to try and budget for it.  He said he'll have to call back for scheduling.    Darrell Whitaker  [facility]  [facility]  [address]

## 2015-09-12 ENCOUNTER — Encounter: Payer: Self-pay | Admitting: Legal Medicine

## 2015-09-12 ENCOUNTER — Ambulatory Visit: Payer: Commercial Managed Care - PPO | Admitting: Legal Medicine

## 2015-09-12 VITALS — BP 132/82 | HR 60 | Temp 97.0°F | Resp 16 | Wt 185.2 lb

## 2015-09-12 VITALS — BP 132/82 | HR 60 | Temp 97.0°F | Resp 16 | Ht 72.0 in | Wt 185.2 lb

## 2015-09-12 DIAGNOSIS — F324 Major depressive disorder, single episode, in partial remission: Principal | ICD-10-CM

## 2015-09-12 NOTE — Nursing Note (Signed)
Patient identification verified by last name and date of birth.  Since you last saw a [facility] provider, have you seen by any other physicians or surgeons outside [facility] , been to emergency room/urgent care or been hospitalized? no  Have you started any over the counter medications, herbal supplements, vitamins or home remedies? No  Patient here today for Telepsych discussion.  Darrell Whitaker Katrinka [facility]

## 2015-09-12 NOTE — Progress Notes (Signed)
Chief Complaint   Patient presents with    Other     Telepsych discussion       Darrell Whitaker is a [age]yr old male who presents for depression follow up.     He has been seen by Telepsychiatry; this is follow up for that visit.    He had not started the prescribed sertraline.     He is feeling much better, much less depressed.    Still angry at his wife. He has been reading a book that has been very helpful.  Anger is under better control.  He read "Real Love" by Clemencia CourseGreg Bear.   She is still causing problems; now financial.     REVIEW OF SYSTEMS:  Manic symptoms; none.  Body temperatures: no fever apparent.  Constitutional: negative.  GI: negative.  Neuro: negative.    Patient Active Problem List   Diagnosis    Shoulder impingement Left    Costochondritis    Situational stress    History of pancreatitis    Major depressive disorder, single episode, moderate     No past medical history on file.  Social History   Substance Use Topics    Smoking status: Never Smoker    Smokeless tobacco: Never Used    Alcohol use 1.2 oz/week     2 Standard drinks or equivalent per week         ACTIVE MEDICATIONS (patient reports taking):  No outpatient prescriptions have been marked as taking for the 09/12/15 encounter (Office Visit) with Lorenda HatchetJames William Halley Kincer, MD.       ALLERGIES:  No Known Allergies    PHYSICAL EXAM:   BP 132/82  Pulse 60  Temp 36.1 C (97 F) (Tympanic)  Resp 16  Ht 1.829 m (6')  Wt 84 kg (185 lb 3.2 oz)  BMI 25.12 kg/m2  GEN:   The patient appears in no apparent distress and is well developed and well nourished  PSYCHOLOGICAL:  PHQ9: 2.  Major criteria; 0,1.  Suicidal Ideation; 0.    Mental Status Examination:    Appearance: Well related, cooperative, good eye contact, fully oriented    Mood: "Good"    Affect: Full and appropriate, with normal range    Speech: Normal - regular rate, rhythm and volume    Motor: Normal - no tics, tremors or extrapyramidal effects (EPS) noted    Thought Process: No formal  thought disorders appreciated    Thought Content: No abnormalities of thought content are appreciated, including no suicidal ideation or intent    Insight: Good    Judgment: Good    Reliability: Good      ASSESSMENT & PLAN:  (F32.4) Major depressive disorder with single episode, in partial remission  (primary encounter diagnosis)  Comment: established problem improving.  No pharmacological treatment indicated at this time.  Plan: expectant management.       Discussed option of psychologic counseling; patient has been in counseling.    Return if symptoms worsen or fail to improve.     Patient instructed to seek medical reevaluation if treatment is not as effective as anticipated, condition worsens, or has side effects from medication.  Barriers to Learning assessed: none. Patient verbalizes understanding of teaching and instructions.    Electronically signed by:  Elaina HoopsJames Christy Ehrsam, MD  Diplomate of American Board of Family Practice  [facility] [facility]  Phone: [phone]

## 2015-10-22 ENCOUNTER — Other Ambulatory Visit: Payer: Self-pay | Admitting: Legal Medicine

## 2016-01-08 ENCOUNTER — Ambulatory Visit: Payer: Self-pay

## 2016-01-22 ENCOUNTER — Ambulatory Visit: Payer: Self-pay

## 2016-01-23 ENCOUNTER — Ambulatory Visit: Payer: Self-pay

## 2018-03-30 DIAGNOSIS — F4323 Adjustment disorder with mixed anxiety and depressed mood: Secondary | ICD-10-CM | POA: Diagnosis not present

## 2018-04-20 DIAGNOSIS — F4323 Adjustment disorder with mixed anxiety and depressed mood: Secondary | ICD-10-CM | POA: Diagnosis not present

## 2018-04-29 DIAGNOSIS — F4323 Adjustment disorder with mixed anxiety and depressed mood: Secondary | ICD-10-CM | POA: Diagnosis not present

## 2018-05-05 DIAGNOSIS — F4323 Adjustment disorder with mixed anxiety and depressed mood: Secondary | ICD-10-CM | POA: Diagnosis not present

## 2018-05-19 DIAGNOSIS — F4323 Adjustment disorder with mixed anxiety and depressed mood: Secondary | ICD-10-CM | POA: Diagnosis not present

## 2019-04-17 DIAGNOSIS — M9903 Segmental and somatic dysfunction of lumbar region: Secondary | ICD-10-CM | POA: Diagnosis not present

## 2019-04-17 DIAGNOSIS — M9904 Segmental and somatic dysfunction of sacral region: Secondary | ICD-10-CM | POA: Diagnosis not present

## 2019-04-17 DIAGNOSIS — M545 Low back pain: Secondary | ICD-10-CM | POA: Diagnosis not present

## 2019-04-17 DIAGNOSIS — M461 Sacroiliitis, not elsewhere classified: Secondary | ICD-10-CM | POA: Diagnosis not present

## 2019-04-18 DIAGNOSIS — M9903 Segmental and somatic dysfunction of lumbar region: Secondary | ICD-10-CM | POA: Diagnosis not present

## 2019-04-18 DIAGNOSIS — M461 Sacroiliitis, not elsewhere classified: Secondary | ICD-10-CM | POA: Diagnosis not present

## 2019-04-18 DIAGNOSIS — M545 Low back pain: Secondary | ICD-10-CM | POA: Diagnosis not present

## 2019-04-18 DIAGNOSIS — M9904 Segmental and somatic dysfunction of sacral region: Secondary | ICD-10-CM | POA: Diagnosis not present

## 2019-04-26 DIAGNOSIS — M9903 Segmental and somatic dysfunction of lumbar region: Secondary | ICD-10-CM | POA: Diagnosis not present

## 2019-04-26 DIAGNOSIS — M461 Sacroiliitis, not elsewhere classified: Secondary | ICD-10-CM | POA: Diagnosis not present

## 2019-04-26 DIAGNOSIS — M545 Low back pain: Secondary | ICD-10-CM | POA: Diagnosis not present

## 2019-04-26 DIAGNOSIS — M9904 Segmental and somatic dysfunction of sacral region: Secondary | ICD-10-CM | POA: Diagnosis not present

## 2019-04-28 DIAGNOSIS — M9904 Segmental and somatic dysfunction of sacral region: Secondary | ICD-10-CM | POA: Diagnosis not present

## 2019-04-28 DIAGNOSIS — M461 Sacroiliitis, not elsewhere classified: Secondary | ICD-10-CM | POA: Diagnosis not present

## 2019-04-28 DIAGNOSIS — M545 Low back pain: Secondary | ICD-10-CM | POA: Diagnosis not present

## 2019-04-28 DIAGNOSIS — M9903 Segmental and somatic dysfunction of lumbar region: Secondary | ICD-10-CM | POA: Diagnosis not present

## 2019-05-08 DIAGNOSIS — M9903 Segmental and somatic dysfunction of lumbar region: Secondary | ICD-10-CM | POA: Diagnosis not present

## 2019-05-08 DIAGNOSIS — M545 Low back pain: Secondary | ICD-10-CM | POA: Diagnosis not present

## 2019-05-08 DIAGNOSIS — M461 Sacroiliitis, not elsewhere classified: Secondary | ICD-10-CM | POA: Diagnosis not present

## 2019-05-08 DIAGNOSIS — M9904 Segmental and somatic dysfunction of sacral region: Secondary | ICD-10-CM | POA: Diagnosis not present

## 2019-07-05 DIAGNOSIS — R0789 Other chest pain: Secondary | ICD-10-CM | POA: Diagnosis not present

## 2019-07-05 DIAGNOSIS — S29011A Strain of muscle and tendon of front wall of thorax, initial encounter: Secondary | ICD-10-CM | POA: Diagnosis not present

## 2019-07-05 DIAGNOSIS — S39011A Strain of muscle, fascia and tendon of abdomen, initial encounter: Secondary | ICD-10-CM | POA: Diagnosis not present

## 2019-08-17 DIAGNOSIS — K529 Noninfective gastroenteritis and colitis, unspecified: Secondary | ICD-10-CM | POA: Diagnosis not present

## 2019-08-17 DIAGNOSIS — Z20822 Contact with and (suspected) exposure to covid-19: Secondary | ICD-10-CM | POA: Diagnosis not present

## 2019-10-11 DIAGNOSIS — M9903 Segmental and somatic dysfunction of lumbar region: Secondary | ICD-10-CM | POA: Diagnosis not present

## 2019-10-11 DIAGNOSIS — M5136 Other intervertebral disc degeneration, lumbar region: Secondary | ICD-10-CM | POA: Diagnosis not present

## 2019-10-11 DIAGNOSIS — M5416 Radiculopathy, lumbar region: Secondary | ICD-10-CM | POA: Diagnosis not present

## 2019-10-11 DIAGNOSIS — M62830 Muscle spasm of back: Secondary | ICD-10-CM | POA: Diagnosis not present

## 2019-10-12 DIAGNOSIS — M5416 Radiculopathy, lumbar region: Secondary | ICD-10-CM | POA: Diagnosis not present

## 2019-10-12 DIAGNOSIS — M9903 Segmental and somatic dysfunction of lumbar region: Secondary | ICD-10-CM | POA: Diagnosis not present

## 2019-10-12 DIAGNOSIS — M62830 Muscle spasm of back: Secondary | ICD-10-CM | POA: Diagnosis not present

## 2019-10-12 DIAGNOSIS — M5136 Other intervertebral disc degeneration, lumbar region: Secondary | ICD-10-CM | POA: Diagnosis not present

## 2019-10-18 DIAGNOSIS — M5416 Radiculopathy, lumbar region: Secondary | ICD-10-CM | POA: Diagnosis not present

## 2019-10-18 DIAGNOSIS — M9903 Segmental and somatic dysfunction of lumbar region: Secondary | ICD-10-CM | POA: Diagnosis not present

## 2019-10-18 DIAGNOSIS — M62830 Muscle spasm of back: Secondary | ICD-10-CM | POA: Diagnosis not present

## 2019-10-18 DIAGNOSIS — M5136 Other intervertebral disc degeneration, lumbar region: Secondary | ICD-10-CM | POA: Diagnosis not present

## 2019-10-19 DIAGNOSIS — M9903 Segmental and somatic dysfunction of lumbar region: Secondary | ICD-10-CM | POA: Diagnosis not present

## 2019-10-19 DIAGNOSIS — M5136 Other intervertebral disc degeneration, lumbar region: Secondary | ICD-10-CM | POA: Diagnosis not present

## 2019-10-19 DIAGNOSIS — M62830 Muscle spasm of back: Secondary | ICD-10-CM | POA: Diagnosis not present

## 2019-10-19 DIAGNOSIS — M5416 Radiculopathy, lumbar region: Secondary | ICD-10-CM | POA: Diagnosis not present

## 2019-10-21 DIAGNOSIS — M62830 Muscle spasm of back: Secondary | ICD-10-CM | POA: Diagnosis not present

## 2019-10-21 DIAGNOSIS — M5416 Radiculopathy, lumbar region: Secondary | ICD-10-CM | POA: Diagnosis not present

## 2019-10-21 DIAGNOSIS — M5136 Other intervertebral disc degeneration, lumbar region: Secondary | ICD-10-CM | POA: Diagnosis not present

## 2019-10-21 DIAGNOSIS — M9903 Segmental and somatic dysfunction of lumbar region: Secondary | ICD-10-CM | POA: Diagnosis not present

## 2019-10-23 DIAGNOSIS — M5136 Other intervertebral disc degeneration, lumbar region: Secondary | ICD-10-CM | POA: Diagnosis not present

## 2019-10-23 DIAGNOSIS — M5416 Radiculopathy, lumbar region: Secondary | ICD-10-CM | POA: Diagnosis not present

## 2019-10-23 DIAGNOSIS — M9903 Segmental and somatic dysfunction of lumbar region: Secondary | ICD-10-CM | POA: Diagnosis not present

## 2019-10-23 DIAGNOSIS — M62830 Muscle spasm of back: Secondary | ICD-10-CM | POA: Diagnosis not present

## 2019-10-25 DIAGNOSIS — M5136 Other intervertebral disc degeneration, lumbar region: Secondary | ICD-10-CM | POA: Diagnosis not present

## 2019-10-25 DIAGNOSIS — M62830 Muscle spasm of back: Secondary | ICD-10-CM | POA: Diagnosis not present

## 2019-10-25 DIAGNOSIS — M9903 Segmental and somatic dysfunction of lumbar region: Secondary | ICD-10-CM | POA: Diagnosis not present

## 2019-10-25 DIAGNOSIS — M5416 Radiculopathy, lumbar region: Secondary | ICD-10-CM | POA: Diagnosis not present

## 2019-10-26 DIAGNOSIS — M62830 Muscle spasm of back: Secondary | ICD-10-CM | POA: Diagnosis not present

## 2019-10-26 DIAGNOSIS — M9903 Segmental and somatic dysfunction of lumbar region: Secondary | ICD-10-CM | POA: Diagnosis not present

## 2019-10-26 DIAGNOSIS — M5416 Radiculopathy, lumbar region: Secondary | ICD-10-CM | POA: Diagnosis not present

## 2019-10-26 DIAGNOSIS — M5136 Other intervertebral disc degeneration, lumbar region: Secondary | ICD-10-CM | POA: Diagnosis not present

## 2019-10-30 DIAGNOSIS — M9903 Segmental and somatic dysfunction of lumbar region: Secondary | ICD-10-CM | POA: Diagnosis not present

## 2019-10-30 DIAGNOSIS — M5136 Other intervertebral disc degeneration, lumbar region: Secondary | ICD-10-CM | POA: Diagnosis not present

## 2019-10-30 DIAGNOSIS — M62830 Muscle spasm of back: Secondary | ICD-10-CM | POA: Diagnosis not present

## 2019-10-30 DIAGNOSIS — M5416 Radiculopathy, lumbar region: Secondary | ICD-10-CM | POA: Diagnosis not present

## 2019-11-01 DIAGNOSIS — M62830 Muscle spasm of back: Secondary | ICD-10-CM | POA: Diagnosis not present

## 2019-11-01 DIAGNOSIS — M9903 Segmental and somatic dysfunction of lumbar region: Secondary | ICD-10-CM | POA: Diagnosis not present

## 2019-11-01 DIAGNOSIS — M5136 Other intervertebral disc degeneration, lumbar region: Secondary | ICD-10-CM | POA: Diagnosis not present

## 2019-11-01 DIAGNOSIS — M5416 Radiculopathy, lumbar region: Secondary | ICD-10-CM | POA: Diagnosis not present

## 2019-11-04 DIAGNOSIS — M5136 Other intervertebral disc degeneration, lumbar region: Secondary | ICD-10-CM | POA: Diagnosis not present

## 2019-11-04 DIAGNOSIS — M5416 Radiculopathy, lumbar region: Secondary | ICD-10-CM | POA: Diagnosis not present

## 2019-11-04 DIAGNOSIS — M62830 Muscle spasm of back: Secondary | ICD-10-CM | POA: Diagnosis not present

## 2019-11-04 DIAGNOSIS — M9903 Segmental and somatic dysfunction of lumbar region: Secondary | ICD-10-CM | POA: Diagnosis not present

## 2019-11-06 DIAGNOSIS — M62830 Muscle spasm of back: Secondary | ICD-10-CM | POA: Diagnosis not present

## 2019-11-06 DIAGNOSIS — M5136 Other intervertebral disc degeneration, lumbar region: Secondary | ICD-10-CM | POA: Diagnosis not present

## 2019-11-06 DIAGNOSIS — M5416 Radiculopathy, lumbar region: Secondary | ICD-10-CM | POA: Diagnosis not present

## 2019-11-06 DIAGNOSIS — M9903 Segmental and somatic dysfunction of lumbar region: Secondary | ICD-10-CM | POA: Diagnosis not present

## 2019-11-13 DIAGNOSIS — M9903 Segmental and somatic dysfunction of lumbar region: Secondary | ICD-10-CM | POA: Diagnosis not present

## 2019-11-13 DIAGNOSIS — M5416 Radiculopathy, lumbar region: Secondary | ICD-10-CM | POA: Diagnosis not present

## 2019-11-13 DIAGNOSIS — M62830 Muscle spasm of back: Secondary | ICD-10-CM | POA: Diagnosis not present

## 2019-11-13 DIAGNOSIS — M5136 Other intervertebral disc degeneration, lumbar region: Secondary | ICD-10-CM | POA: Diagnosis not present

## 2019-11-25 DIAGNOSIS — B354 Tinea corporis: Secondary | ICD-10-CM | POA: Diagnosis not present

## 2019-12-19 DIAGNOSIS — Z20822 Contact with and (suspected) exposure to covid-19: Secondary | ICD-10-CM | POA: Diagnosis not present

## 2019-12-19 DIAGNOSIS — U071 COVID-19: Secondary | ICD-10-CM | POA: Diagnosis not present

## 2019-12-27 ENCOUNTER — Emergency Department
Admission: EM | Admit: 2019-12-27 | Discharge: 2019-12-27 | Disposition: A | Discharge status: Home / Self Care | Payer: BC Managed Care – PPO | Attending: Emergency Medicine | Admitting: Emergency Medicine

## 2019-12-27 ENCOUNTER — Emergency Department: Payer: BC Managed Care – PPO

## 2019-12-27 ENCOUNTER — Other Ambulatory Visit: Payer: Self-pay

## 2019-12-27 DIAGNOSIS — R5383 Other fatigue: Secondary | ICD-10-CM | POA: Insufficient documentation

## 2019-12-27 DIAGNOSIS — R21 Rash and other nonspecific skin eruption: Secondary | ICD-10-CM | POA: Diagnosis not present

## 2019-12-27 DIAGNOSIS — U071 COVID-19: Secondary | ICD-10-CM | POA: Insufficient documentation

## 2019-12-27 DIAGNOSIS — R05 Cough: Secondary | ICD-10-CM | POA: Diagnosis not present

## 2019-12-27 MED ORDER — IVERMECTIN 3 MG PO TABS: 150.0000 ug/kg | ORAL_TABLET | Freq: Every day | ORAL | 0 refills | Status: AC

## 2019-12-27 MED ORDER — PREDNISONE 20 MG PO TABS
40.0000 mg | ORAL_TABLET | Freq: Every day | ORAL | 0 refills | Status: AC
Start: 2019-12-27 — End: 2020-01-01

## 2019-12-27 NOTE — ED Triage Notes (Signed)
Pt dx with COVID 8 days ago and states each day he was feeling better and better but today felt worse. C/o congestion and fatigue and cough. Is vaccinated. A&O, ambulatory.

## 2019-12-27 NOTE — Discharge Instructions (Signed)
Your chest XR is negative for pneumonia. Take the prescription meds as directed.

## 2019-12-27 NOTE — ED Provider Notes (Signed)
[facility] Emergency Department Provider Note ____________________________________________  Time seen: 1605  I have reviewed the triage vital signs and the nursing notes.  HISTORY  Chief Complaint  Fatigue and Cough  HPI Zachary Wilkins is a 59 y.o. male presents himself to the ED for evaluation of slightly worsening symptoms related to his Covid diagnosis.  Patient, who is fully vaccinated, describes being diagnosed almost 10 days prior.  He is nearing the end of his quarantine.  He became concerned because he began to experience increasing fatigue, and cough, and shortness of breath.   He denies any interim fever, nausea, vomiting, diarrhea.  He also denies any significant shortness of breath.  He has been taking over-the-counter medications for his symptoms intermittently, but was never prescribed any steroids, inhalers, cough medicine, or antibiotics.  History reviewed. No pertinent past medical history.  There are no problems to display for this patient.   Past Surgical History:  Procedure Laterality Date  . ABDOMINAL SURGERY      Prior to Admission medications   Medication Sig Start Date End Date Taking? Authorizing Provider  ivermectin (STROMECTOL) 3 MG TABS tablet Take 4 tablets (12,000 mcg total) by mouth daily for 5 doses. 12/27/19 01/01/20  Logon Uttech, Charlesetta Ivory, PA-C  predniSONE (DELTASONE) 20 MG tablet Take 2 tablets (40 mg total) by mouth daily with breakfast for 5 days. 12/27/19 01/01/20  Shalev Helminiak, Charlesetta Ivory, PA-C    Allergies Patient has no known allergies.  History reviewed. No pertinent family history.  Social History Social History   Tobacco Use  . Smoking status: Never Smoker  Substance Use Topics  . Alcohol use: Yes    Comment: "very light"  . Drug use: Not on file    Review of Systems  Constitutional: Negative for fever. Eyes: Negative for visual changes. ENT: Negative for sore throat. Cardiovascular: Negative for  chest pain. Respiratory: Positive for shortness of breath and cough. Gastrointestinal: Negative for abdominal pain, vomiting and diarrhea. Genitourinary: Negative for dysuria. Musculoskeletal: Negative for back pain. Skin: Negative for rash. Neurological: Negative for headaches, focal weakness or numbness. ____________________________________________  PHYSICAL EXAM:  VITAL SIGNS: ED Triage Vitals  Enc Vitals Group     BP 12/27/19 1355 (!) 136/99     Pulse Rate 12/27/19 1355 99     Resp 12/27/19 1355 18     Temp 12/27/19 1355 98.4 F (36.9 C)     Temp Source 12/27/19 1355 Oral     SpO2 12/27/19 1355 99 %     Weight 12/27/19 1356 182 lb (82.6 kg)     Height 12/27/19 1356 6' (1.829 m)     Head Circumference --      Peak Flow --      Pain Score 12/27/19 1356 0     Pain Loc --      Pain Edu? --      Excl. in GC? --     Constitutional: Alert and oriented. Well appearing and in no distress. Head: Normocephalic and atraumatic. Eyes: Conjunctivae are normal. PERRL. Normal extraocular movements Ears: Canals clear. TMs intact bilaterally. Nose: No congestion/rhinorrhea/epistaxis. Mouth/Throat: Mucous membranes are moist. Neck: Supple. No thyromegaly. Hematological/Lymphatic/Immunological: No cervical lymphadenopathy. Cardiovascular: Normal rate, regular rhythm. Normal distal pulses. Respiratory: Normal respiratory effort. No wheezes/rales/rhonchi. Gastrointestinal: Soft and nontender. No distention. Musculoskeletal: Nontender with normal range of motion in all extremities.  Neurologic:  Normal gait without ataxia. Normal speech and language. No gross focal neurologic deficits are appreciated. Skin:  Skin  is warm, dry and intact. No rash noted. ____________________________________________   RADIOLOGY  CXR  IMPRESSION: No active disease ____________________________________________  PROCEDURES  Procedures ____________________________________________  INITIAL IMPRESSION /  ASSESSMENT AND PLAN / ED COURSE  Patient with ED evaluation of continued symptoms related to recent Covid diagnosis.  Patient presented due to concern over increased cough and fatigue in the last few days.  Clinically patient is stable without signs of acute respiratory distress, dehydration, or septic appearance.  Chest x-ray does not reveal any acute intrathoracic process.  Patient is reassured overall by his exam findings.  He will be treated at this time with prescriptions with steroids and ivermectin, as he personally requested.  He will follow-up with his primary provider return to the ED as needed.  Hughes Wyndham was evaluated in Emergency Department on 12/28/2019 for the symptoms described in the history of present illness. He was evaluated in the context of the global COVID-19 pandemic, which necessitated consideration that the patient might be at risk for infection with the SARS-CoV-2 virus that causes COVID-19. Institutional protocols and algorithms that pertain to the evaluation of patients at risk for COVID-19 are in a state of rapid change based on information released by regulatory bodies including the CDC and federal and state organizations. These policies and algorithms were followed during the patient's care in the ED. ____________________________________________  FINAL CLINICAL IMPRESSION(S) / ED DIAGNOSES  Final diagnoses:  COVID-19      Lissa Hoard, PA-C 12/28/19 1537    Phineas Semen, MD 12/28/19 1725

## 2019-12-27 NOTE — ED Notes (Addendum)
Pt tested positive for covid x 1 week ago pt state his symptoms have improved until today, and pt is worried about his cough and fatigue r/t his 10 day quarantine coming up.

## 2020-01-05 ENCOUNTER — Ambulatory Visit: Payer: Self-pay

## 2020-01-05 NOTE — Telephone Encounter (Signed)
Pt. Reports he was diagnosed with COVID 19 and seen in ED. He has completed his medications and has been back to work. Reports his fatigue has not gone away and he can not stay at work all day. Concerned because the fatigue is "very intense." Pt. Does not have a PCP. Given the Post COVID 19 Care clinic phone number.  Answer Assessment - Initial Assessment Questions 1. COVID-19 DIAGNOSIS: "Who made your Coronavirus (COVID-19) diagnosis?" "Was it confirmed by a positive lab test?" If not diagnosed by a HCP, ask "Are there lots of cases (community spread) where you live?" (See public health department website, if unsure)     [place] 2. COVID-19 EXPOSURE: "Was there any known exposure to COVID before the symptoms began?" CDC Definition of close contact: within 6 feet (2 meters) for a total of 15 minutes or more over a 24-hour period.      Unsure 3. ONSET: "When did the COVID-19 symptoms start?"      12/27/19 4. WORST SYMPTOM: "What is your worst symptom?" (e.g., cough, fever, shortness of breath, muscle aches)     Fatigue 5. COUGH: "Do you have a cough?" If Yes, ask: "How bad is the cough?"       No 6. FEVER: "Do you have a fever?" If Yes, ask: "What is your temperature, how was it measured, and when did it start?"     No 7. RESPIRATORY STATUS: "Describe your breathing?" (e.g., shortness of breath, wheezing, unable to speak)      No 8. BETTER-SAME-WORSE: "Are you getting better, staying the same or getting worse compared to yesterday?"  If getting worse, ask, "In what way?"     Worse 9. HIGH RISK DISEASE: "Do you have any chronic medical problems?" (e.g., asthma, heart or lung disease, weak immune system, obesity, etc.)     No 10. PREGNANCY: "Is there any chance you are pregnant?" "When was your last menstrual period?"       n/a 11. OTHER SYMPTOMS: "Do you have any other symptoms?"  (e.g., chills, fatigue, headache, loss of smell or taste, muscle pain, sore throat; new loss of smell or taste  especially support the diagnosis of COVID-19)       Fatigue  Protocols used: CORONAVIRUS (COVID-19) DIAGNOSED OR SUSPECTED-A-AH

## 2020-01-08 DIAGNOSIS — Z8616 Personal history of COVID-19: Secondary | ICD-10-CM | POA: Diagnosis not present

## 2020-01-08 DIAGNOSIS — R5383 Other fatigue: Secondary | ICD-10-CM | POA: Diagnosis not present

## 2020-01-16 ENCOUNTER — Ambulatory Visit (INDEPENDENT_AMBULATORY_CARE_PROVIDER_SITE_OTHER): Payer: BC Managed Care – PPO | Admitting: Nurse Practitioner

## 2020-01-16 ENCOUNTER — Other Ambulatory Visit: Payer: Self-pay

## 2020-01-16 VITALS — BP 128/88 | HR 70 | Temp 97.3°F | Ht 72.0 in | Wt 188.8 lb

## 2020-01-16 DIAGNOSIS — R5383 Other fatigue: Secondary | ICD-10-CM | POA: Diagnosis not present

## 2020-01-16 DIAGNOSIS — R439 Unspecified disturbances of smell and taste: Secondary | ICD-10-CM | POA: Insufficient documentation

## 2020-01-16 DIAGNOSIS — Z8616 Personal history of COVID-19: Secondary | ICD-10-CM | POA: Diagnosis not present

## 2020-01-16 NOTE — Progress Notes (Signed)
@Patient  ID: , male    DOB: 11-06-1960, 59 y.o.   MRN: [id number]  Chief Complaint  Patient presents with  . COVID 19    Positive 8/3. Sx: extreme fatigue, taste and smell come and goes. Just finished prenisone Sunday    Referring provider: No ref. provider found   59 year old male with no significant health history.  Diagnosed with Covid on 12/19/2019.   HPI  Patient presents today for post COVID care clinic visit.  Patient was seen in the ED on 12/27/2019 and was prescribed steroids and ivermectin as Covid treatment.  Patient was then seen at [facility] on 01/08/2020 and had lab work-up which was overall normal including sed rate.  Patient was prescribed another round of prednisone at that time.  Patient states that he has been having ongoing fatigue and that the second round of prednisone did help improve this fatigue.  He also states that he has been having intermittent loss of taste and smell.  Denies any significant shortness of breath or cough.  Patient states that he has been trying to stay well-hydrated and has been eating.  He does live at home alone in an apartment and states that he has not been exercising very much.  He does plan to return to work next week.  Denies f/c/s, n/v/d, hemoptysis, PND, chest pain or edema.      No Known Allergies   There is no immunization history on file for this patient.  History reviewed. No pertinent past medical history.  Tobacco History: Social History   Tobacco Use  Smoking Status Never Smoker  Smokeless Tobacco Never Used   Counseling given: Yes   No outpatient encounter medications on file as of 01/16/2020.   No facility-administered encounter medications on file as of 01/16/2020.     Review of Systems  Review of Systems  Constitutional: Positive for fatigue. Negative for fever.  HENT: Negative.   Respiratory: Negative for cough and shortness of breath.   Cardiovascular: Negative.  Negative for chest  pain, palpitations and leg swelling.  Gastrointestinal: Negative.   Allergic/Immunologic: Negative.   Neurological: Negative.        Loss of taste and smell  Psychiatric/Behavioral: Negative.        Physical Exam  BP 128/88 (BP Location: Left Arm)   Pulse 70   Temp (!) 97.3 F (36.3 C)   Ht 6' (1.829 m)   Wt 188 lb 12 oz (85.6 kg)   SpO2 98%   BMI 25.60 kg/m   Wt Readings from Last 5 Encounters:  01/16/20 188 lb 12 oz (85.6 kg)  12/27/19 182 lb (82.6 kg)     Physical Exam Vitals and nursing note reviewed.  Constitutional:      General: He is not in acute distress.    Appearance: He is well-developed.  Cardiovascular:     Rate and Rhythm: Normal rate and regular rhythm.  Pulmonary:     Effort: Pulmonary effort is normal.     Breath sounds: Normal breath sounds.  Musculoskeletal:     Right lower leg: No edema.     Left lower leg: No edema.  Skin:    General: Skin is warm and dry.  Neurological:     Mental Status: He is alert and oriented to person, place, and time.  Psychiatric:        Mood and Affect: Mood normal.        Behavior: Behavior normal.      Imaging: DG  Chest Portable 1 View  Result Date: 12/27/2019 CLINICAL DATA:  59 year old male with positive COVID-19. Vaccinated. EXAM: PORTABLE CHEST 1 VIEW COMPARISON:  None. FINDINGS: No focal consolidation, pleural effusion, or pneumothorax. The cardiac silhouette is within limits. No acute osseous pathology. IMPRESSION: No active disease. Electronically Signed   By: Elgie Collard M.D.   On: 12/27/2019 17:05     Assessment & Plan:   History of COVID-19 Fatigue Loss of Smell:  Hand out given on [employer] re-training program  Stay active - start walking routine  Stay well hydrated  May start vitamin C 3,000 mg, vitamin D3 2,000 IU, zinc sulfate 220mg , and quercetin 500mg  twice daily over the next 5-15 days   Follow up:  Follow up as needed      , NP 01/16/2020

## 2020-01-16 NOTE — Patient Instructions (Signed)
History of Covid Fatigue Loss of Smell:  Hand out given on [employer]-training program  Stay active - start walking routine  Stay well hydrated  May start vitamin C 3,000 mg, vitamin D3 2,000 IU, zinc sulfate 220mg , and quercetin 500mg  twice daily over the next 5-15 days   Follow up:  Follow up as needed

## 2020-01-16 NOTE — Assessment & Plan Note (Signed)
Fatigue Loss of Smell:  Hand out given on [employer] re-training program  Stay active - start walking routine  Stay well hydrated  May start vitamin C 3,000 mg, vitamin D3 2,000 IU, zinc sulfate 220mg , and quercetin 500mg  twice daily over the next 5-15 days   Follow up:  Follow up as needed

## 2020-05-27 DIAGNOSIS — M5136 Other intervertebral disc degeneration, lumbar region: Secondary | ICD-10-CM | POA: Diagnosis not present

## 2020-05-27 DIAGNOSIS — M9903 Segmental and somatic dysfunction of lumbar region: Secondary | ICD-10-CM | POA: Diagnosis not present

## 2020-05-27 DIAGNOSIS — M5416 Radiculopathy, lumbar region: Secondary | ICD-10-CM | POA: Diagnosis not present

## 2020-05-27 DIAGNOSIS — M62830 Muscle spasm of back: Secondary | ICD-10-CM | POA: Diagnosis not present

## 2020-08-06 DIAGNOSIS — M9903 Segmental and somatic dysfunction of lumbar region: Secondary | ICD-10-CM | POA: Diagnosis not present

## 2020-08-06 DIAGNOSIS — M62830 Muscle spasm of back: Secondary | ICD-10-CM | POA: Diagnosis not present

## 2020-08-06 DIAGNOSIS — M5136 Other intervertebral disc degeneration, lumbar region: Secondary | ICD-10-CM | POA: Diagnosis not present

## 2020-08-06 DIAGNOSIS — M5416 Radiculopathy, lumbar region: Secondary | ICD-10-CM | POA: Diagnosis not present

## 2020-12-19 DIAGNOSIS — M5416 Radiculopathy, lumbar region: Secondary | ICD-10-CM | POA: Diagnosis not present

## 2020-12-19 DIAGNOSIS — M5136 Other intervertebral disc degeneration, lumbar region: Secondary | ICD-10-CM | POA: Diagnosis not present

## 2020-12-19 DIAGNOSIS — M9903 Segmental and somatic dysfunction of lumbar region: Secondary | ICD-10-CM | POA: Diagnosis not present

## 2020-12-19 DIAGNOSIS — M62830 Muscle spasm of back: Secondary | ICD-10-CM | POA: Diagnosis not present

## 2021-02-06 DIAGNOSIS — K219 Gastro-esophageal reflux disease without esophagitis: Secondary | ICD-10-CM | POA: Diagnosis not present

## 2021-02-06 DIAGNOSIS — J209 Acute bronchitis, unspecified: Secondary | ICD-10-CM | POA: Diagnosis not present

## 2021-02-06 DIAGNOSIS — J019 Acute sinusitis, unspecified: Secondary | ICD-10-CM | POA: Diagnosis not present

## 2021-02-06 DIAGNOSIS — H66001 Acute suppurative otitis media without spontaneous rupture of ear drum, right ear: Secondary | ICD-10-CM | POA: Diagnosis not present

## 2021-03-01 ENCOUNTER — Emergency Department: Payer: BC Managed Care – PPO

## 2021-03-01 ENCOUNTER — Emergency Department
Admission: EM | Admit: 2021-03-01 | Discharge: 2021-03-01 | Disposition: A | Discharge status: Home / Self Care | Payer: BC Managed Care – PPO | Attending: Emergency Medicine | Admitting: Emergency Medicine

## 2021-03-01 ENCOUNTER — Other Ambulatory Visit: Payer: Self-pay

## 2021-03-01 ENCOUNTER — Encounter: Payer: Self-pay | Admitting: Intensive Care

## 2021-03-01 DIAGNOSIS — Z8616 Personal history of COVID-19: Secondary | ICD-10-CM | POA: Diagnosis not present

## 2021-03-01 DIAGNOSIS — S39012A Strain of muscle, fascia and tendon of lower back, initial encounter: Secondary | ICD-10-CM | POA: Insufficient documentation

## 2021-03-01 DIAGNOSIS — S34109A Unspecified injury to unspecified level of lumbar spinal cord, initial encounter: Secondary | ICD-10-CM | POA: Diagnosis not present

## 2021-03-01 DIAGNOSIS — S3992XA Unspecified injury of lower back, initial encounter: Secondary | ICD-10-CM | POA: Diagnosis not present

## 2021-03-01 DIAGNOSIS — M545 Low back pain, unspecified: Secondary | ICD-10-CM | POA: Diagnosis not present

## 2021-03-01 DIAGNOSIS — Y92197 Garden or yard of other specified residential institution as the place of occurrence of the external cause: Secondary | ICD-10-CM | POA: Insufficient documentation

## 2021-03-01 DIAGNOSIS — W172XXA Fall into hole, initial encounter: Secondary | ICD-10-CM | POA: Insufficient documentation

## 2021-03-01 HISTORY — DX: Acute pancreatitis without necrosis or infection, unspecified: K85.90

## 2021-03-01 HISTORY — DX: Gastro-esophageal reflux disease without esophagitis: K21.9

## 2021-03-01 MED ORDER — LIDOCAINE 5 % EX PTCH
1.0000 | MEDICATED_PATCH | Freq: Two times a day (BID) | CUTANEOUS | 0 refills | Status: AC | PRN
Start: 2021-03-01 — End: 2021-03-11

## 2021-03-01 MED ORDER — CYCLOBENZAPRINE HCL 5 MG PO TABS
5.0000 mg | ORAL_TABLET | Freq: Three times a day (TID) | ORAL | 0 refills | Status: AC | PRN
Start: 2021-03-01 — End: ?

## 2021-03-01 NOTE — ED Triage Notes (Signed)
Patient reports Monday he was in the yard and stepped in a hole that caused his body to jerk and now experiencing lower back pain.

## 2021-03-01 NOTE — Discharge Instructions (Signed)
Your exam, XR, and CT scan are reassuring. There is no evidence of any new compression fractures. Take the prescription meds as directed.

## 2021-03-01 NOTE — ED Notes (Signed)
Patient stable and discharged with all personal belongings and AVS. AVS and discharge instructions reviewed with patient and opportunity for questions provided.   

## 2021-03-01 NOTE — ED Provider Notes (Signed)
[facility] Emergency Department Provider Note ____________________________________________  Time seen: 1645  I have reviewed the triage vital signs and the nursing notes.  HISTORY  Chief Complaint  Back Pain  HPI Zachary Wilkins is a 60 y.o. male presents to the ED for evaluation of low back pain.  Patient describes a near fall in a hole in the yard, causing him to jerk his back.  He denies any head injury or LOC.  As such denies any lower extremity pain or joint discomfort.  He presents with midline low back pain since the incident. He reports some relief following his to intermittent chiropractor adjustments.  He has been taking ibuprofen with limited benefit.  He denies any distal paresthesias, bladder bowel incontinence, foot drop, or saddle anesthesia.  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 Authorizing Provider  cyclobenzaprine (FLEXERIL) 5 MG tablet Take 1 tablet (5 mg total) by mouth 3 (three) times daily as needed. 03/01/21  Yes Deja Kaigler, Charlesetta Ivory, PA-C  lidocaine (LIDODERM) 5 % Place 1 patch onto the skin every 12 (twelve) hours as needed for up to 10 days. Remove & Discard patch after 12 hours of wear each day. 03/01/21 03/11/21 Yes Eber Ferrufino, Charlesetta Ivory, PA-C    Allergies Patient has no known allergies.  Family History  Problem Relation Age of Onset   Heart disease Father    Cancer Paternal Uncle    Stroke Maternal Grandmother    Stroke Paternal Grandmother    Cancer Paternal Grandfather     Social History Social History   Tobacco Use   Smoking status: Never   Smokeless tobacco: Never  Substance  Use Topics   Alcohol use: Yes    Comment: "very light"   Drug use: Never    Review of Systems  Constitutional: Negative for fever. Eyes: Negative for visual changes. ENT: Negative for sore throat. Cardiovascular: Negative for chest pain. Respiratory: Negative for shortness of breath. Gastrointestinal: Negative for abdominal pain, vomiting and diarrhea. Genitourinary: Negative for dysuria. Musculoskeletal: Positive for back pain. Skin: Negative for rash. Neurological: Negative for headaches, focal weakness or numbness. ____________________________________________  PHYSICAL EXAM:  VITAL SIGNS: ED Triage Vitals  Enc Vitals Group     BP 03/01/21 1439 (!) 167/72     Pulse Rate 03/01/21 1439 83     Resp 03/01/21 1439 16     Temp 03/01/21 1439 98.4 F (36.9 C)     Temp Source 03/01/21 1439 Oral     SpO2 03/01/21 1439 98 %     Weight 03/01/21 1440 188 lb (85.3 kg)     Height 03/01/21 1440 6' (1.829 m)     Head Circumference --      Peak Flow --      Pain Score 03/01/21 1439 8     Pain Loc --      Pain Edu? --      Excl. in GC? --     Constitutional: Alert and oriented. Well appearing and in no distress. Head: Normocephalic and atraumatic. Eyes: Conjunctivae are normal. Normal extraocular movements Neck: Supple. No thyromegaly. Cardiovascular: Normal  rate, regular rhythm. Normal distal pulses. Respiratory: Normal respiratory effort. No wheezes/rales/rhonchi. Gastrointestinal: Soft and nontender. No distention. Musculoskeletal: Normal spinal alignment with some mild midline tenderness to the low back.  Normal transition from sit to stand.  Patient with limited flexion range to the knees.  Nontender with normal range of motion in all extremities.  Neurologic: Cranial nerves II to XII grossly intact.  Normal LE DTRs bilaterally.  Normal gait without ataxia. Normal speech and language. No gross focal neurologic deficits are appreciated. Skin:  Skin is warm, dry and intact. No  rash noted. Psychiatric: Mood and affect are normal. Patient exhibits appropriate insight and judgment. ____________________________________________    {LABS (pertinent positives/negatives)  ____________________________________________  {EKG  ____________________________________________   RADIOLOGY Official radiology report(s): DG Lumbar Spine Complete  Result Date: 03/01/2021 CLINICAL DATA:  Pain after twisting injury. L4 fracture 23 years ago. EXAM: LUMBAR SPINE - COMPLETE 4+ VIEW COMPARISON:  None. FINDINGS: Five lumbar type vertebral bodies. Sacroiliac joints are symmetric. Mild superior endplate compression deformity at L4 with approximately 10% vertebral body height loss. Intervertebral disc heights are maintained. Aortic atherosclerosis. IMPRESSION: Mild superior endplate compression deformity at L4, of indeterminate acuity given above clinical history. Electronically Signed   By: Jeronimo Greaves M.D.   On: 03/01/2021 16:47   CT Lumbar Spine Wo Contrast  Result Date: 03/01/2021 CLINICAL DATA:  Low back pain, trauma possible acute L4 compression fracture EXAM: CT LUMBAR SPINE WITHOUT CONTRAST TECHNIQUE: Multidetector CT imaging of the lumbar spine was performed without intravenous contrast administration. Multiplanar CT image reconstructions were also generated. COMPARISON:  None. FINDINGS: Segmentation: 5 lumbar type vertebrae. Alignment: No significant listhesis. Vertebrae: Mild loss of height at the superior endplate of L4 appears chronic. Vertebral body heights are otherwise maintained. Paraspinal and other soft tissues: Unremarkable. Disc levels: Intervertebral disc heights are maintained. There are minimal disc bulges. No significant canal or foraminal stenosis at any level. IMPRESSION: Chronic mild loss of height at the superior endplate of L4. Electronically Signed   By: Guadlupe Spanish M.D.   On: 03/01/2021 17:35    ____________________________________________  PROCEDURES   Procedures ____________________________________________   INITIAL IMPRESSION / ASSESSMENT AND PLAN / ED COURSE  As part of my medical decision making, I reviewed the following data within the electronic MEDICAL RECORD NUMBER Radiograph reviewed as noted and Notes from prior ED visits   Patient ED evaluation of acute midline low back pain following mechanical injury 1 week prior.  Patient presents for ongoing symptoms.  He is evaluated with complaints, found to have an x-ray that is concerning for age-indeterminate endplate compression vomiting L4.  Patient gives a remote history of an L4 fracture some 20+ years prior.  He was further evaluated with CT imaging which confirmed a chronic stable L4 compression deformity.  Patient will be treated with anti-inflammatories and muscle relaxants, as well as topical pain patches.  Follow-up with primary provider chiropractor for ongoing management.  Return precautions of been reviewed.  Dewel Lotter was evaluated in Emergency Department on 03/01/2021 for the symptoms described in the history of present illness. He was evaluated in the context of the global COVID-19 pandemic, which necessitated consideration that the patient might be at risk for infection with the SARS-CoV-2 virus that causes COVID-19. Institutional protocols and algorithms that pertain to the evaluation of patients at risk for COVID-19 are in a state of rapid change based on information released by regulatory bodies including the CDC and federal and state organizations. These policies and algorithms  were followed during the patient's care in the ED. ____________________________________________  FINAL CLINICAL IMPRESSION(S) / ED DIAGNOSES  Final diagnoses:  Strain of lumbar region, initial encounter      Lissa Hoard, PA-C 03/01/21 1851    Shaune Pollack, MD 03/02/21 [phone]

## 2021-03-02 ENCOUNTER — Telehealth: Payer: Self-pay | Admitting: Emergency Medicine

## 2021-03-02 MED ORDER — CYCLOBENZAPRINE HCL 10 MG PO TABS
10.0000 mg | ORAL_TABLET | Freq: Three times a day (TID) | ORAL | 0 refills | Status: AC | PRN
Start: 2021-03-02 — End: ?

## 2021-03-02 NOTE — Telephone Encounter (Cosign Needed)
Patient returns to the emergency department for prescription.  Patient was seen yesterday, prescribed lidocaine patches and Flexeril.  The Flexeril did not transmit to the pharmacy.  Lidocaine prescription did.  At this time I will represcribe the patient's Flexeril.

## 2021-06-12 LAB — COLOGUARD: COLOGUARD: POSITIVE — AB

## 2021-10-30 IMAGING — DX DG CHEST 1V PORT
1 series · 1 of 1 positions shown · non-contrast
Comparison: None.

CLINICAL DATA: 59-year-old male with positive BBHUM-E1. Vaccinated.

EXAM:
PORTABLE CHEST 1 VIEW

[chest ap]
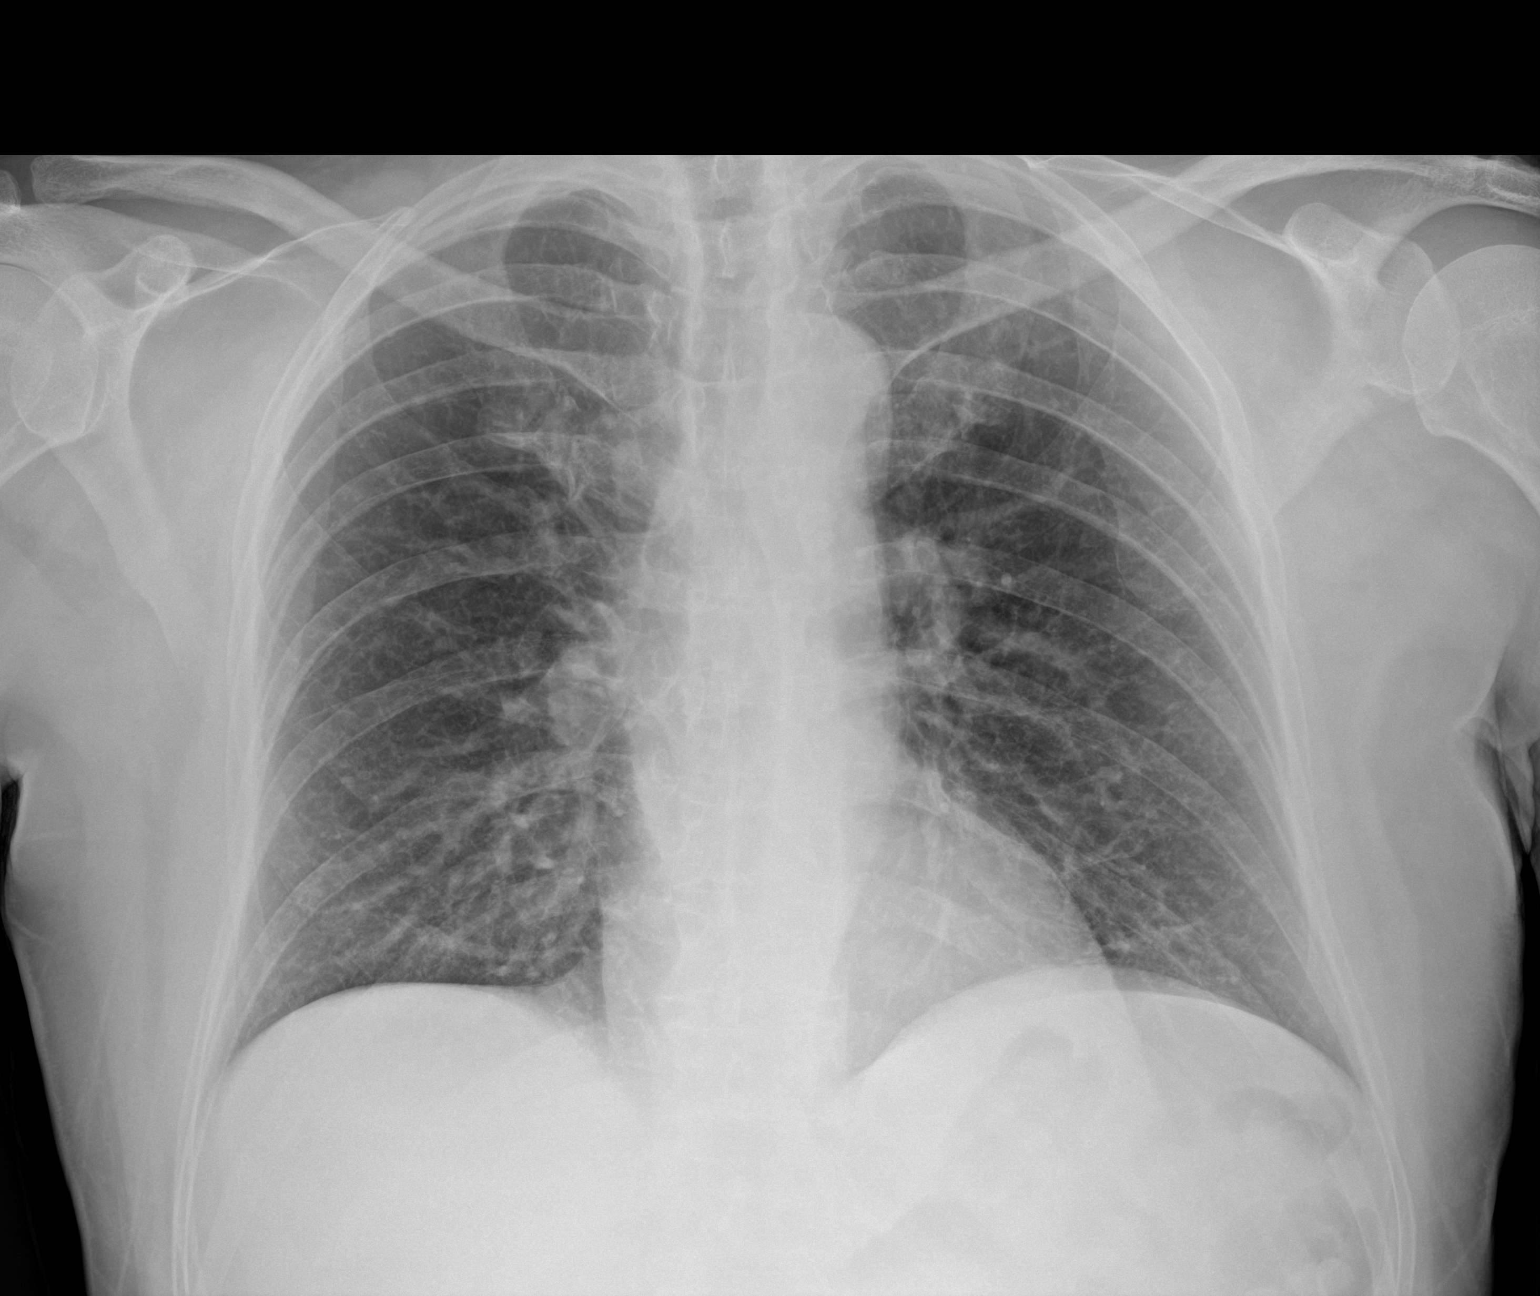

[1 of 1 positions shown; findings below may reference images not displayed]

FINDINGS: No focal consolidation, pleural effusion, or pneumothorax. The
cardiac silhouette is within limits. No acute osseous pathology.
IMPRESSION: No active disease.

## 2023-01-03 IMAGING — CR DG LUMBAR SPINE COMPLETE 4+V
5 series · 5 of 5 positions shown · non-contrast
Comparison: None.

CLINICAL DATA: Pain after twisting injury. L4 fracture 23 years
ago.

EXAM:
LUMBAR SPINE - COMPLETE 4+ VIEW

[l-spine ap]
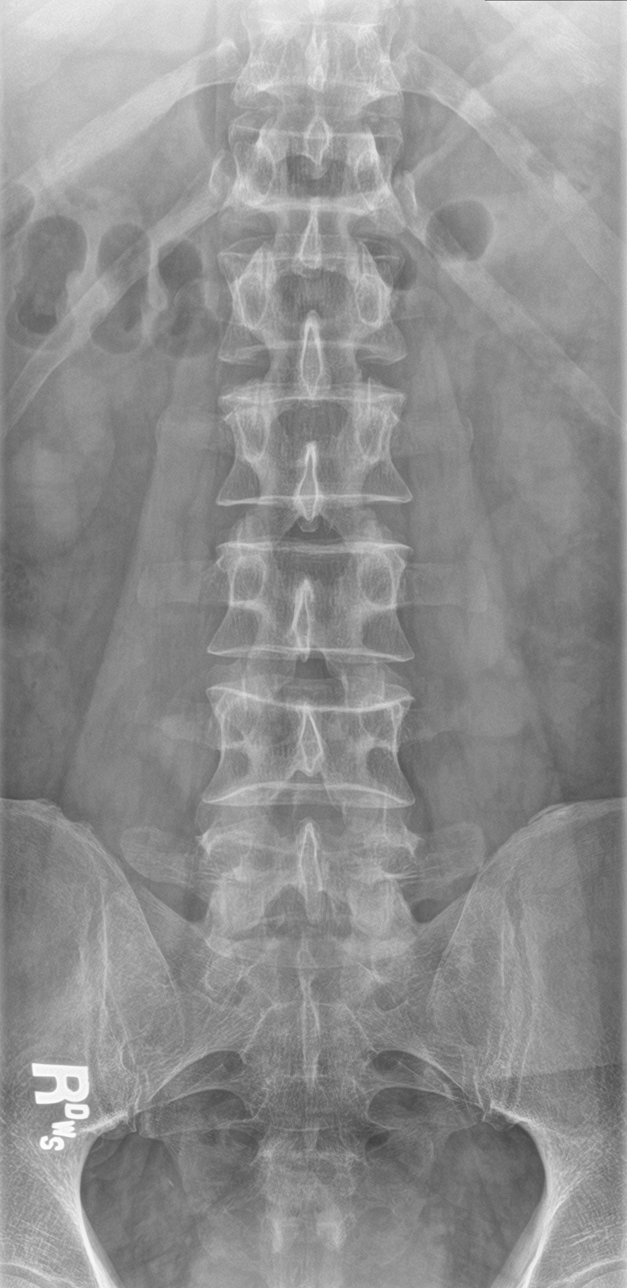

[l-spine obl (1 of 2)]
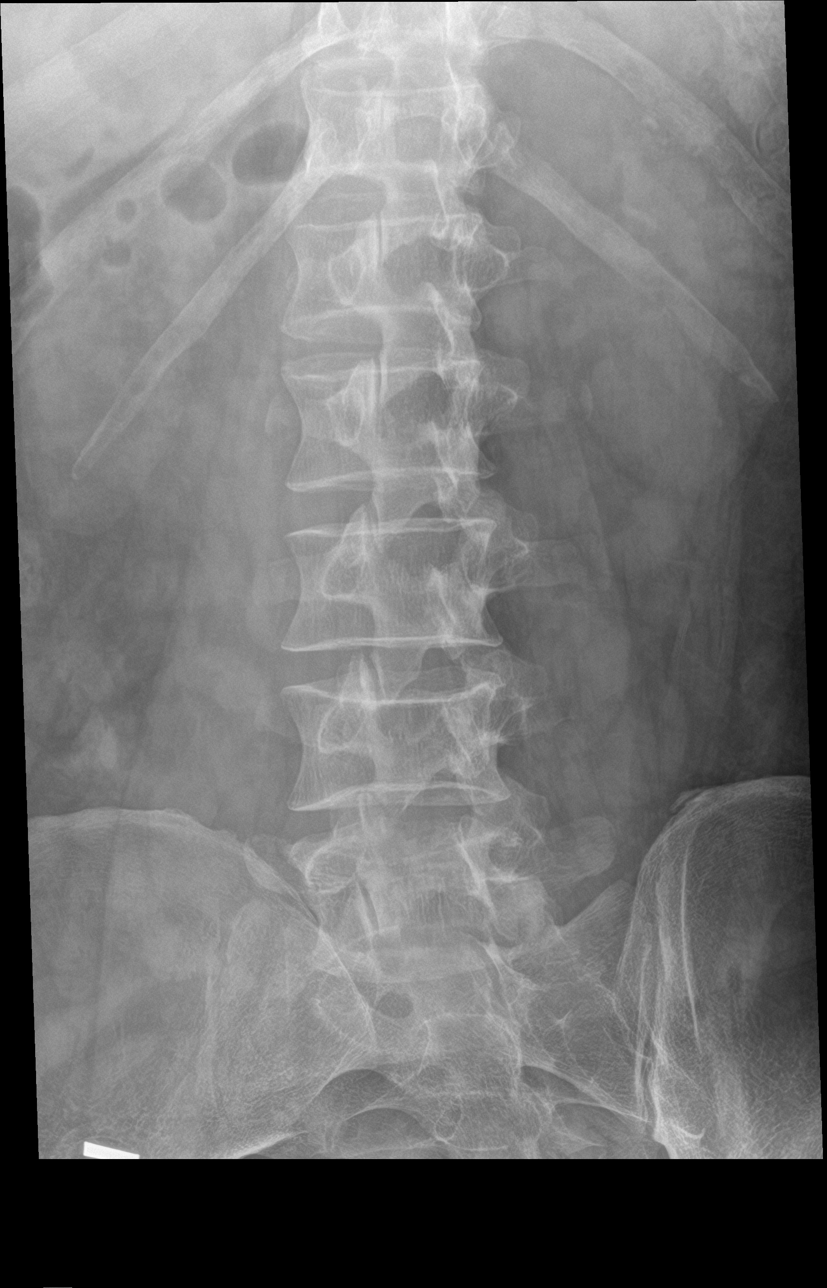

[l-spine obl (2 of 2)]
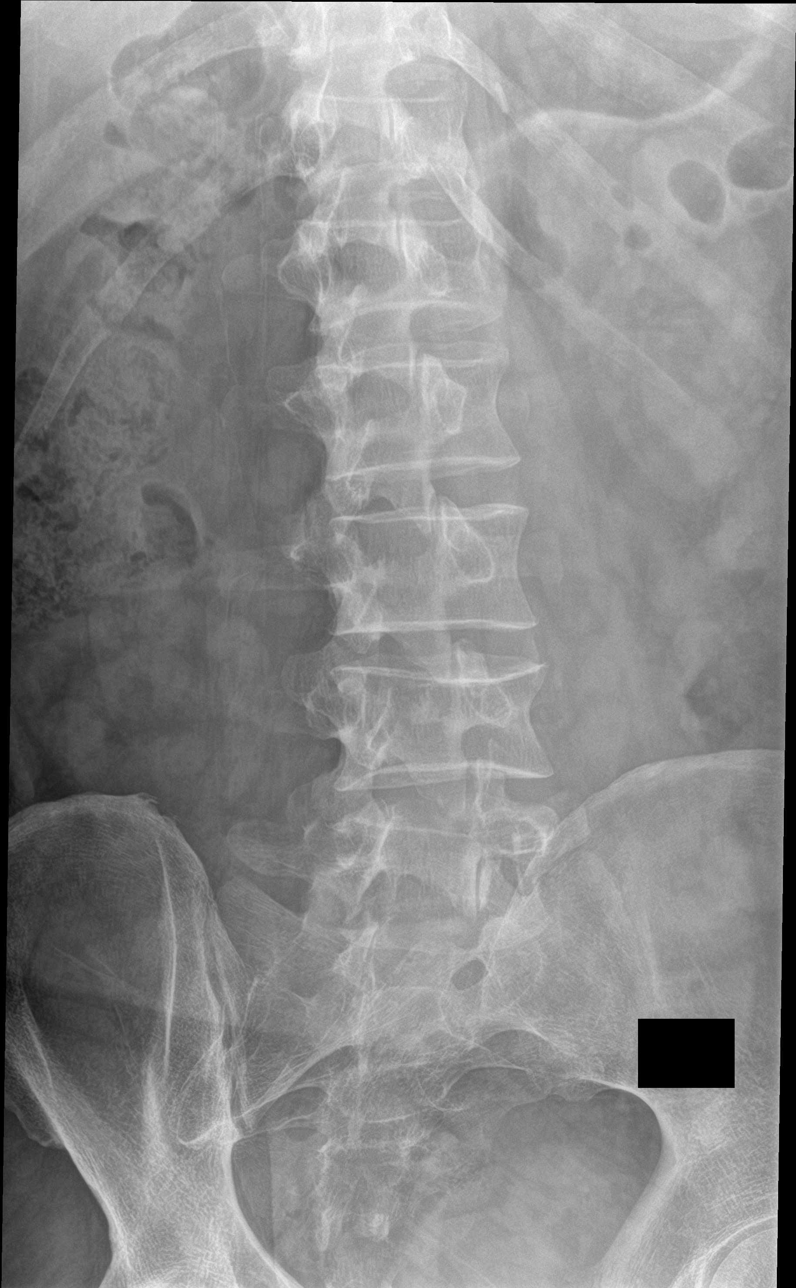

[l-spine lat]
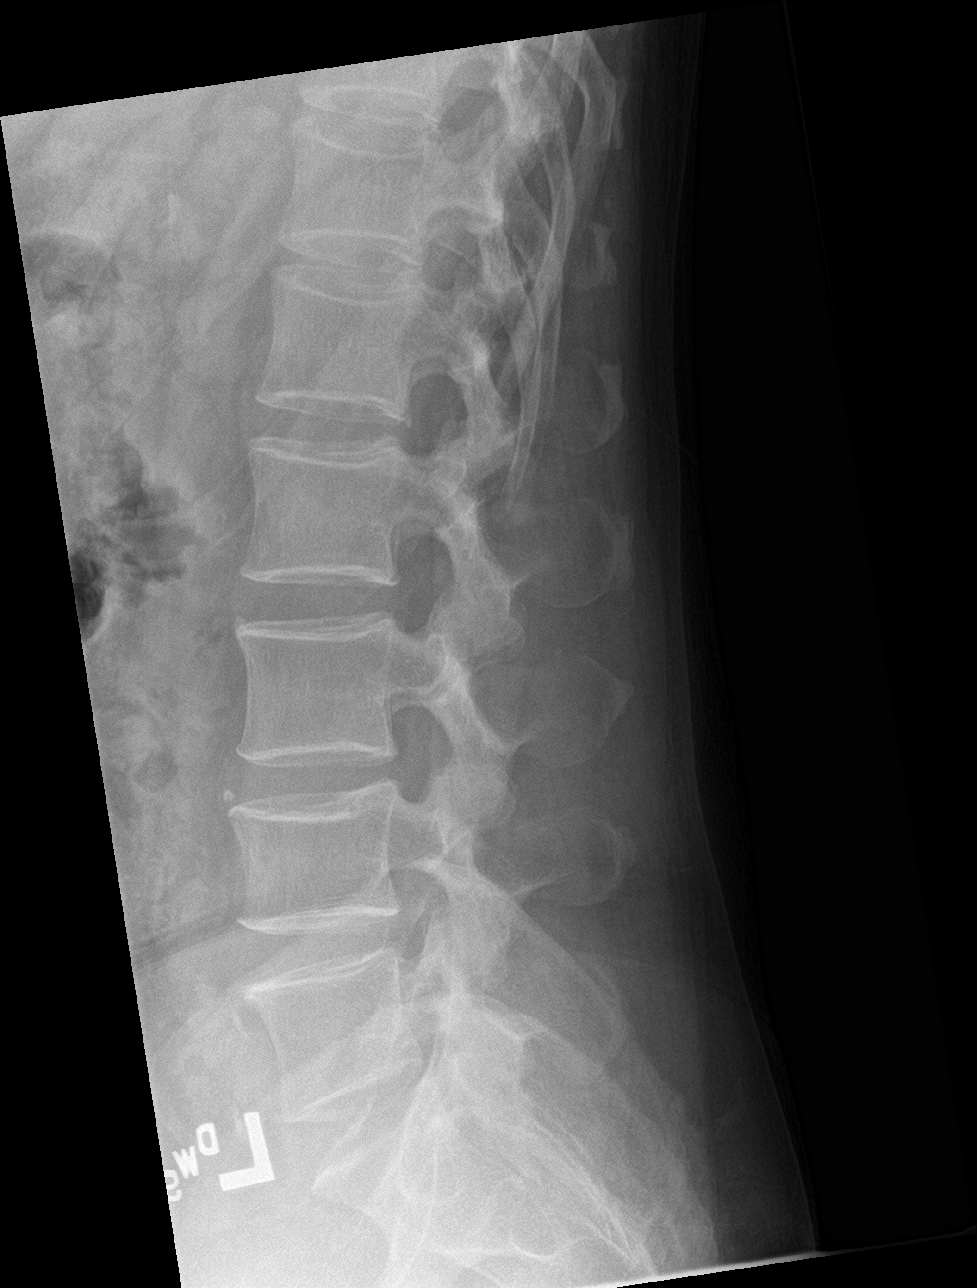

[l-spine spot]
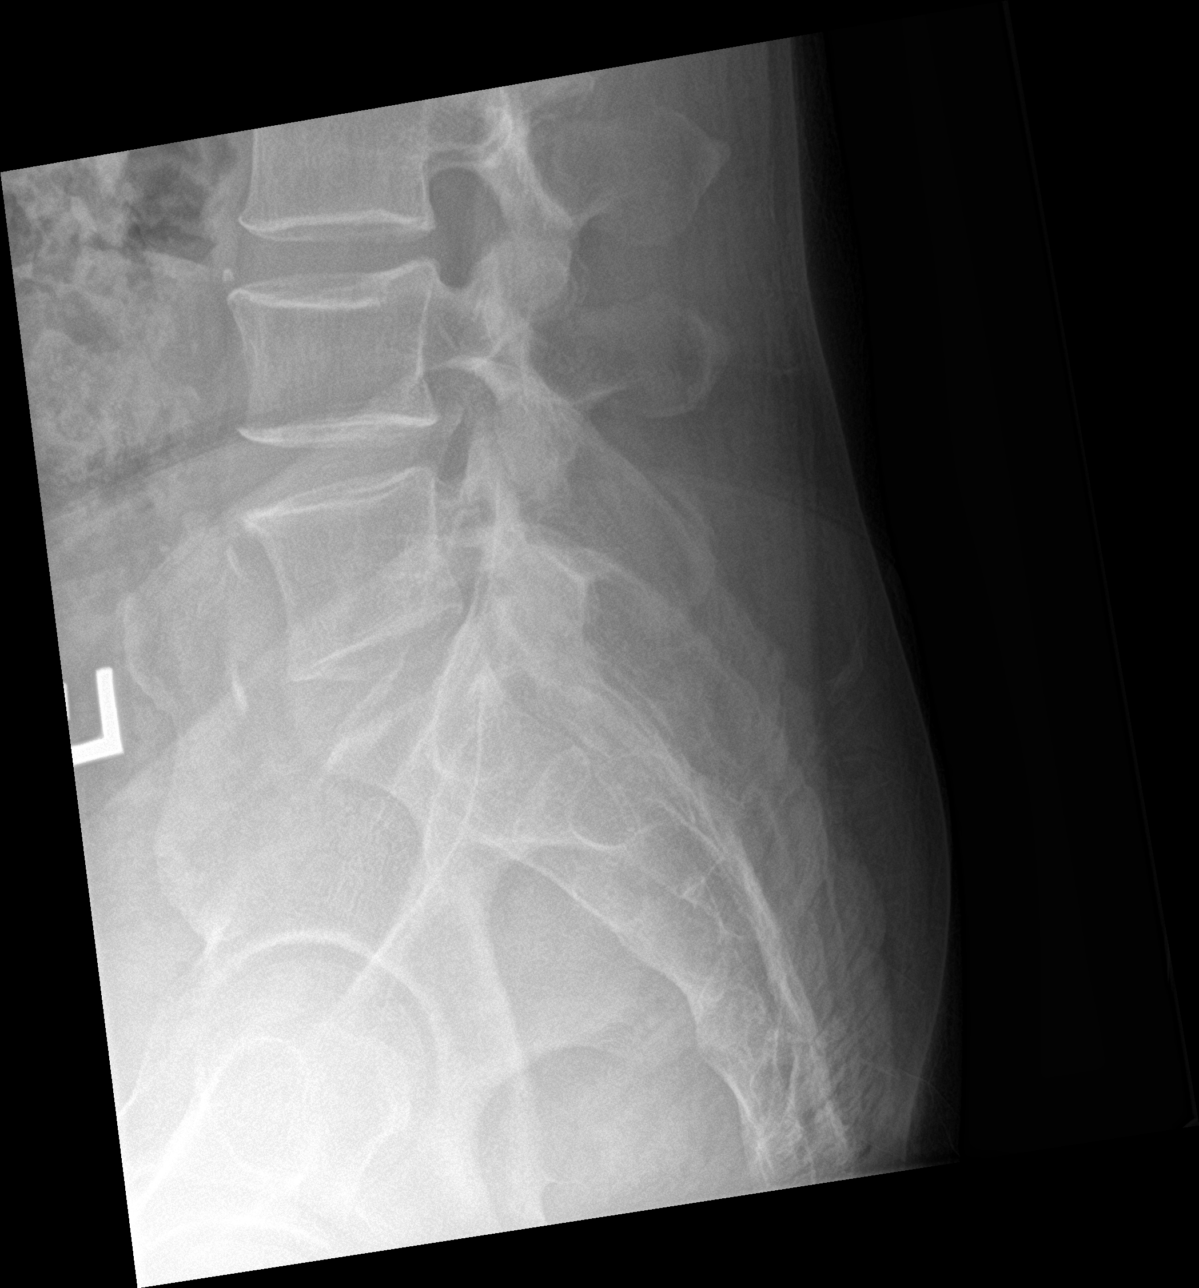

[5 of 5 positions shown; findings below may reference images not displayed]

FINDINGS: Five lumbar type vertebral bodies. Sacroiliac joints are symmetric.
Mild superior endplate compression deformity at L4 with
approximately 10% vertebral body height loss. Intervertebral disc
heights are maintained. Aortic atherosclerosis.
IMPRESSION: Mild superior endplate compression deformity at L4, of indeterminate
acuity given above clinical history.

## 2023-01-07 ENCOUNTER — Ambulatory Visit: Payer: BC Managed Care – PPO | Admitting: Skilled Nursing Facility1

## 2023-02-23 ENCOUNTER — Encounter: Payer: Commercial Managed Care - PPO | Attending: Infectious Diseases | Admitting: Dietician

## 2023-02-23 ENCOUNTER — Encounter: Payer: Self-pay | Admitting: Dietician

## 2023-02-23 VITALS — Wt 174.0 lb

## 2023-02-23 DIAGNOSIS — E119 Type 2 diabetes mellitus without complications: Secondary | ICD-10-CM | POA: Diagnosis present

## 2023-02-23 NOTE — Patient Instructions (Signed)
Goal: go on a 30 minute walk once each weekend.   Goal: implement a snack between breakfast and lunch (When snacking, aim to include a complex carb and protein.)  Goal: start packing lunch at least 2-3 days per week. (At meals, aim to include 1/2 plate non-starchy vegetables, 1/4 plate protein, and 1/4 plate complex carbs).

## 2023-02-23 NOTE — Progress Notes (Signed)
Diabetes Self-Management Education  Visit Type: First/Initial  Appt. Start Time: 1530 Appt. End Time: 1625  02/23/2023  Mr. [facility], identified by name and date of birth, is a 62 y.o. male with a diagnosis of Diabetes: Type 2.   ASSESSMENT  History includes: reviewed: GERD, type 2 diabetes Labs noted: A1c 6.8 10/05/22 (recent A1c pt reports 6.1) Medications include: reviewed Supplements:  Pt brought A1c down from 6.8 to 6.1 over the last few months. Pt reports to do this he stopped having dessert nighty, started watching carbohydrate intake including more complex carbs, stopped eating fast food as often, and was having [place] daily for breakfast.   Pt states he enjoys cooking and cooks 2-4 nights per week, but states if he can't cook he will get chipotle for dinner and does not eat all the rice. Pt lives with cousins but states he is the one who usually cooks.   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   Pt states between breakfast and lunch he thinks his blood sugar drops and states he had hypoglycemia when he was little.   Weight 174 lb (78.9 kg). Body mass index is 23.6 kg/m.   Diabetes Self-Management Education - 02/23/23 1526       Visit Information   Visit Type First/Initial      Initial Visit   Diabetes Type Type 2    Date Diagnosed 09/2022    Are you currently following a meal plan? Yes    What type of meal plan do you follow? low carb low sugar pt report    Are you taking your medications as prescribed? Not on Medications      Health Coping   How would you rate your overall health? Excellent      Psychosocial Assessment   Patient Belief/Attitude about Diabetes Afraid    What is the hardest part about your diabetes right now, causing you the most concern, or is the most worrisome to you about your diabetes?   Making healty food and beverage choices    Self-care barriers None     Self-management support Doctor's office    Other persons present Patient    Patient Concerns Nutrition/Meal planning    Special Needs None    Preferred Learning Style No preference indicated    Learning Readiness Ready    How often do you need to have someone help you when you read instructions, pamphlets, or other written materials from your doctor or pharmacy? 1 - Never    What is the last grade level you completed in school? trade school      Pre-Education Assessment   Patient understands the diabetes disease and treatment process. Needs Instruction    Patient understands incorporating nutritional management into lifestyle. Needs Instruction    Patient undertands incorporating physical activity into lifestyle. Needs Instruction    Patient understands using medications safely. Needs Instruction    Patient understands monitoring blood glucose, interpreting and using results Needs Instruction    Patient understands prevention, detection, and treatment of acute complications. Needs Instruction    Patient understands prevention, detection, and treatment of chronic complications. Needs Instruction    Patient understands how to develop strategies to address psychosocial issues. Needs Instruction    Patient understands how to develop strategies to promote health/change behavior. Needs Instruction      Complications   Last HgB A1C per patient/outside source 6.1 %  How often do you check your blood sugar? 0 times/day (not testing)    Have you had a dilated eye exam in the past 12 months? No    Have you had a dental exam in the past 12 months? No    Are you checking your feet? No      Dietary Intake   Breakfast 6am: 2 boiled eggs and 1 piece of ham and 1/2 avocado OR multigrain toast with avocado and eggs    Snack (morning) none    Lunch 12pm: lowes food: ham and swiss sandwich and greek yogurt parfait    Snack (afternoon) none    Dinner chipotle burrito bowl OR lamb and asparagus and  potato    Snack (evening) none    Beverage(s) coffee with honey, 64+ oz water      Activity / Exercise   Activity / Exercise Type ADL's    How many days per week do you exercise? 0    How many minutes per day do you exercise? 0    Total minutes per week of exercise 0      Patient Education   Previous Diabetes Education No    Disease Pathophysiology Definition of diabetes, type 1 and 2, and the diagnosis of diabetes;Factors that contribute to the development of diabetes;Explored patient's options for treatment of their diabetes    Healthy Eating Role of diet in the treatment of diabetes and the relationship between the three main macronutrients and blood glucose level;Plate Method;Meal timing in regards to the patients' current diabetes medication.;Information on hints to eating out and maintain blood glucose control.;Meal options for control of blood glucose level and chronic complications.    Being Active Role of exercise on diabetes management, blood pressure control and cardiac health.;Identified with patient nutritional and/or medication changes necessary with exercise.;Helped patient identify appropriate exercises in relation to his/her diabetes, diabetes complications and other health issue.    Monitoring Identified appropriate SMBG and/or A1C goals.;Daily foot exams;Yearly dilated eye exam    Acute complications Taught prevention, symptoms, and  treatment of hypoglycemia - the 15 rule.    Chronic complications Relationship between chronic complications and blood glucose control;Identified and discussed with patient  current chronic complications    Diabetes Stress and Support Identified and addressed patients feelings and concerns about diabetes;Worked with patient to identify barriers to care and solutions;Role of stress on diabetes    Lifestyle and Health Coping Lifestyle issues that need to be addressed for better diabetes care      Individualized Goals (developed by patient)    Nutrition General guidelines for healthy choices and portions discussed    Physical Activity Exercise 1-2 times per week;30 minutes per day    Medications take my medication as prescribed    Monitoring  Test my blood glucose as discussed    Problem Solving Eating Pattern    Reducing Risk examine blood glucose patterns;do foot checks daily;treat hypoglycemia with 15 grams of carbs if blood glucose less than 70mg /dL    Health Coping Ask for help with psychological, social, or emotional issues      Post-Education Assessment   Patient understands the diabetes disease and treatment process. Comprehends key points    Patient understands incorporating nutritional management into lifestyle. Comprehends key points    Patient undertands incorporating physical activity into lifestyle. Comprehends key points    Patient understands using medications safely. Comphrehends key points    Patient understands monitoring blood glucose, interpreting and using results Comprehends key points  Patient understands prevention, detection, and treatment of acute complications. Comprehends key points    Patient understands prevention, detection, and treatment of chronic complications. Comprehends key points    Patient understands how to develop strategies to address psychosocial issues. Comprehends key points    Patient understands how to develop strategies to promote health/change behavior. Comprehends key points      Outcomes   Expected Outcomes Demonstrated interest in learning. Expect positive outcomes    Future DMSE 3-4 months    Program Status Not Completed             Individualized Plan for Diabetes Self-Management Training:   Learning Objective:  Patient will have a greater understanding of diabetes self-management. Patient education plan is to attend individual and/or group sessions per assessed needs and concerns.   Plan:   Patient Instructions  Goal: go on a 30 minute walk once each weekend.    Goal: implement a snack between breakfast and lunch (When snacking, aim to include a complex carb and protein.)  Goal: start packing lunch at least 2-3 days per week. (At meals, aim to include 1/2 plate non-starchy vegetables, 1/4 plate protein, and 1/4 plate complex carbs).    Expected Outcomes:  Demonstrated interest in learning. Expect positive outcomes  Education material provided: ADA - How to Thrive: A Guide for Your Journey with Diabetes and My Plate, non-starchy vegetable list  If problems or questions, patient to contact team via:  Phone  Future DSME appointment: 3-4 months

## 2023-05-25 ENCOUNTER — Ambulatory Visit: Payer: Commercial Managed Care - PPO | Admitting: Dietician
